# Patient Record
Sex: Female | Born: 2000 | Race: White | Hispanic: No | Marital: Single | Smoking: Never smoker
Health system: Southern US, Community
[De-identification: ages and names within clinical notes are randomized; demographics above are authoritative.]

## PROBLEM LIST (undated history)

## (undated) DIAGNOSIS — S060X9A Concussion with loss of consciousness of unspecified duration, initial encounter: Secondary | ICD-10-CM

## (undated) DIAGNOSIS — G43909 Migraine, unspecified, not intractable, without status migrainosus: Secondary | ICD-10-CM

## (undated) DIAGNOSIS — N83209 Unspecified ovarian cyst, unspecified side: Secondary | ICD-10-CM

## (undated) DIAGNOSIS — R112 Nausea with vomiting, unspecified: Secondary | ICD-10-CM

## (undated) DIAGNOSIS — D649 Anemia, unspecified: Secondary | ICD-10-CM

## (undated) DIAGNOSIS — N939 Abnormal uterine and vaginal bleeding, unspecified: Secondary | ICD-10-CM

## (undated) DIAGNOSIS — E785 Hyperlipidemia, unspecified: Secondary | ICD-10-CM

## (undated) DIAGNOSIS — R7989 Other specified abnormal findings of blood chemistry: Secondary | ICD-10-CM

## (undated) DIAGNOSIS — F419 Anxiety disorder, unspecified: Secondary | ICD-10-CM

## (undated) DIAGNOSIS — R790 Abnormal level of blood mineral: Secondary | ICD-10-CM

## (undated) DIAGNOSIS — Z9889 Other specified postprocedural states: Secondary | ICD-10-CM

## (undated) HISTORY — DX: Hyperlipidemia, unspecified: E78.5

## (undated) HISTORY — PX: BREAST SURGERY: SHX581

## (undated) HISTORY — DX: Anxiety disorder, unspecified: F41.9

## (undated) HISTORY — DX: Abnormal level of blood mineral: R79.0

## (undated) HISTORY — DX: Abnormal uterine and vaginal bleeding, unspecified: N93.9

---
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
## 2019-01-15 ENCOUNTER — Other Ambulatory Visit: Payer: Self-pay | Admitting: Obstetrics and Gynecology

## 2019-01-15 ENCOUNTER — Ambulatory Visit
Admission: RE | Admit: 2019-01-15 | Discharge: 2019-01-15 | Disposition: A | Discharge status: Home / Self Care | Payer: BLUE CROSS/BLUE SHIELD | Source: Ambulatory Visit | Attending: Obstetrics and Gynecology | Admitting: Obstetrics and Gynecology

## 2019-01-15 ENCOUNTER — Other Ambulatory Visit: Payer: Self-pay

## 2019-01-15 DIAGNOSIS — N6320 Unspecified lump in the left breast, unspecified quadrant: Secondary | ICD-10-CM

## 2019-01-28 ENCOUNTER — Other Ambulatory Visit: Payer: Self-pay | Admitting: Obstetrics and Gynecology

## 2019-01-28 ENCOUNTER — Ambulatory Visit
Admission: RE | Admit: 2019-01-28 | Discharge: 2019-01-28 | Disposition: A | Discharge status: Home / Self Care | Payer: BLUE CROSS/BLUE SHIELD | Source: Ambulatory Visit | Attending: Obstetrics and Gynecology | Admitting: Obstetrics and Gynecology

## 2019-01-28 ENCOUNTER — Other Ambulatory Visit (HOSPITAL_COMMUNITY): Payer: Self-pay | Admitting: Diagnostic Radiology

## 2019-01-28 ENCOUNTER — Other Ambulatory Visit: Payer: Self-pay

## 2019-01-28 DIAGNOSIS — N6320 Unspecified lump in the left breast, unspecified quadrant: Secondary | ICD-10-CM

## 2019-02-19 ENCOUNTER — Telehealth: Payer: Self-pay | Admitting: Obstetrics and Gynecology

## 2019-02-19 NOTE — Telephone Encounter (Signed)
Spoke with patient, advised per Dr. Quincy Simmonds. [id number] precautions reviewed. Patient verbalizes understanding and is agreeable.   Encounter closed.

## 2019-02-19 NOTE — Telephone Encounter (Signed)
Spoke with patient. Reports menses 2 weeks ago, bleeding has restarted, flow is light, changing pad twice daily. Patient reports lower abdominal/pelvic pain with intercourse, pain resolves after. "Newish" partner. OCP for contraceptive, denies any late or missed pills. Denies vaginal odor, discharge, N/V, fever/chills. Recommended OV for further evaluation. Patient is away at college, request OV next week. OV scheduled for 10/12 at 4:30pm with Dr. Quincy Simmonds. Recommended patient take UPT to r/o pregnancy, notify office if positive. Advised I will review with Dr. Quincy Simmonds and return call if any additional recommendations, patient agreeable.   Dr. Quincy Simmonds -please review.

## 2019-02-19 NOTE — Telephone Encounter (Signed)
Patient states she is having abdominal pain and abnormal bleeding. Has been going on for two days. Would like to speak with a nurse.

## 2019-02-19 NOTE — Telephone Encounter (Signed)
I agree with your recommendations.  She can try Ibuprofen 800 mg every 8 hours as needed for pain and try a heating pad.  If her pain increases during the weekend, I recommend going to urgent care.  Otherwise, I will see her on Monday.

## 2019-02-22 ENCOUNTER — Encounter: Payer: Self-pay | Admitting: Obstetrics and Gynecology

## 2019-02-22 ENCOUNTER — Other Ambulatory Visit: Payer: Self-pay

## 2019-02-22 ENCOUNTER — Ambulatory Visit: Payer: BC Managed Care – PPO | Admitting: Obstetrics and Gynecology

## 2019-02-22 VITALS — BP 110/68 | HR 80 | Temp 96.5°F | Ht 68.0 in | Wt 239.0 lb

## 2019-02-22 DIAGNOSIS — N941 Unspecified dyspareunia: Secondary | ICD-10-CM

## 2019-02-22 DIAGNOSIS — Z3009 Encounter for other general counseling and advice on contraception: Secondary | ICD-10-CM | POA: Diagnosis not present

## 2019-02-22 DIAGNOSIS — Z113 Encounter for screening for infections with a predominantly sexual mode of transmission: Secondary | ICD-10-CM

## 2019-02-22 DIAGNOSIS — N926 Irregular menstruation, unspecified: Secondary | ICD-10-CM

## 2019-02-22 LAB — POCT URINE PREGNANCY: Preg Test, Ur: NEGATIVE

## 2019-02-22 NOTE — Progress Notes (Signed)
GYNECOLOGY  VISIT   HPI: 18 y.o.   Single  Caucasian  female   [id number] with Patient's last menstrual period was 02/08/2019 (approximate).   here for irregular bleeding and pain with intercourse.   Irregular bleeding started 4 -5 days ago.  No missed pills.  Her pills are the same as usual.  No substitute at pharmacy. She has had a hx of breakthrough bleeding.  She takes Lamictal and Topamax.  She had pain and bleeding with intercourse.  This was the third time she had sex.  Prior to this, sex was not painful.  New-old partner.  Pain with deep deep penetration.  Using condoms.   No problems with nausea, vomiting, or diarrhea.   May be interested in IUDs.  Considering breast fibroma removal and breast reduction.  UPT:Neg  GYNECOLOGIC HISTORY: Patient's last menstrual period was 02/08/2019 (approximate). Contraception:  OCPs Menopausal hormone therapy:  n/a Last mammogram:  n/a Last pap smear:  n/a        OB History    Gravida  0   Para  0   Term  0   Preterm  0   AB  0   Living  0     SAB  0   TAB  0   Ectopic  0   Multiple  0   Live Births                 Patient Active Problem List   Diagnosis Date Noted  . Episodic tension-type headache, not intractable 03/05/2017  . Daytime somnolence 12/04/2016  . Acanthosis nigricans 08/13/2016  . Obesity 08/13/2016  . Abdominal pain 08/03/2014  . Dysmenorrhea 05/31/2014  . Obesity (BMI 30-39.9) 12/28/2013  . Migraine without aura 11/30/2013  . CHI (closed head injury) 02/09/2013    Past Medical History:  Diagnosis Date  . Abnormal uterine bleeding   . Anxiety   . Concussion 04/05/2013   volleyball  . Elevated serum creatinine 2020  . Hyperlipidemia   . Low ferritin 2019  . Migraines   . Ovarian cyst     Past Surgical History:  Procedure Laterality Date  . CYSTOSCOPY  11/16/14   [facility]  . LAPAROSCOPY N/A 08/16/2014   Procedure: LAPAROSCOPY DIAGNOSTIC, BIOPSY OF POSTERIOR CUL DE [place];   Surgeon: Donnamae Jude, MD;  Location: [facility];  Service: Gynecology;  Laterality: N/A;  . PELVIC LAPAROSCOPY  08/2014   Dr. Shelbie Ammons for endometriosis    Current Outpatient Medications  Medication Sig Dispense Refill  . buPROPion (WELLBUTRIN XL) 300 MG 24 hr tablet Take 300 mg by mouth every morning.    . escitalopram (LEXAPRO) 20 MG tablet     . ferrous sulfate 325 (65 FE) MG tablet Take 325 mg by mouth daily with breakfast.    . lamoTRIgine (LAMICTAL) 100 MG tablet     . norethindrone-ethinyl estradiol (BALZIVA) 0.4-35 MG-MCG tablet Take 1 tablet by mouth daily. 84 tablet 3  . oxycodone-acetaminophen (PERCOCET) 2.5-325 MG tablet Take 1 tablet by mouth as needed. Takes prn abdominal/back pain    . rizatriptan (MAXALT-MLT) 10 MG disintegrating tablet Take 1 tablet (10 mg total) by mouth as needed for migraine. May repeat in 2 hours if needed 10 tablet 5  . topiramate (TOPAMAX) 50 MG tablet TAKE 3 TABLETS BY MOUTH EVERY DAY AT BEDTIME 270 tablet 3   No current facility-administered medications for this visit.      ALLERGIES: Patient has no known allergies.  Family History  Problem  Relation Age of Onset  . Cancer Paternal Grandfather        dec 69 Leukemia  . Breast cancer Paternal Grandmother 45  . Seizures Mother        Epilepsy as child  . Diabetes Maternal Grandfather   . Thyroid disease Maternal Grandmother        hyperthyroid    Social History   Socioeconomic History  . Marital status: Single    Spouse name: Not on file  . Number of children: Not on file  . Years of education: Not on file  . Highest education level: Not on file  Occupational History  . Not on file  Social Needs  . Financial resource strain: Not on file  . Food insecurity    Worry: Not on file    Inability: Not on file  . Transportation needs    Medical: Not on file    Non-medical: Not on file  Tobacco Use  . Smoking status: Never Smoker  . Smokeless tobacco: Never Used  Substance and Sexual  Activity  . Alcohol use: No    Alcohol/week: 0.0 standard drinks  . Drug use: No  . Sexual activity: Never    Birth control/protection: Pill  Lifestyle  . Physical activity    Days per week: Not on file    Minutes per session: Not on file  . Stress: Not on file  Relationships  . Social Herbalist on phone: Not on file    Gets together: Not on file    Attends religious service: Not on file    Active member of club or organization: Not on file    Attends meetings of clubs or organizations: Not on file    Relationship status: Not on file  . Intimate partner violence    Fear of current or ex partner: Not on file    Emotionally abused: Not on file    Physically abused: Not on file    Forced sexual activity: Not on file  Other Topics Concern  . 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   She attended [employer].    She lives on campus during school.    She enjoys basketball, soccer, mission trips, and doing make-up.   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    Review of Systems  All other systems reviewed and are negative.   PHYSICAL EXAMINATION:    BP 110/68 (Cuff Size: Large)   Pulse 80   Temp (!) 96.5 F (35.8 C) (Temporal)   Ht 5\' 8"  (1.727 m)   Wt 239 lb (108.4 kg)   LMP 02/08/2019 (Approximate)   BMI 36.34 kg/m     General appearance: alert, cooperative and appears stated age   Pelvic: External genitalia:  no lesions              Urethra:  normal appearing urethra with no masses, tenderness or lesions              Bartholins and Skenes: normal                 Vagina: normal appearing vagina with normal color and discharge, no lesions              Cervix: no lesions                Bimanual Exam:  Uterus:  normal size, contour, position, consistency, mobility,  non-tender              Adnexa: no mass, fullness, tenderness        Chaperone was present for exam.  ASSESSMENT  Irregular  menses.  On continuous COCs. STD screening.  Dyspareunia.  Breast fibroadenoma.   PLAN  Information on IUDs - verbal and written.  We focused on [place].  We discussed controlling depth of penetration and using lubricants and position changes with sex.  Nuswab for GC/CT/trich.  Unable to do blood work today to complete STD screening.  We discussed breast reduction and fibromas of the breast.  I highlighted the difference of these procedures.   I suggested she have a consultation with a plastic surgeon to understand breast reduction further and help her make her choice. FU prn.    An After Visit Summary was printed and given to the patient.  __25____ minutes face to face time of which over 50% was spent in counseling.

## 2019-02-25 LAB — CHLAMYDIA/GONOCOCCUS/TRICHOMONAS, NAA
Chlamydia by NAA: NEGATIVE
Gonococcus by NAA: NEGATIVE
Trich vag by NAA: NEGATIVE

## 2019-03-24 ENCOUNTER — Telehealth: Payer: Self-pay | Admitting: Obstetrics and Gynecology

## 2019-03-24 DIAGNOSIS — Z3009 Encounter for other general counseling and advice on contraception: Secondary | ICD-10-CM

## 2019-03-24 NOTE — Telephone Encounter (Signed)
Patient would like to schedule IUD placement.

## 2019-03-24 NOTE — Telephone Encounter (Signed)
Spoke with pt. Pt states wanting to schedule IUD placement. Pt currently has OCPs. Pt home from college the week of thanksgiving and request appt that week. Pt scheduled IUD placement 04/06/19 at 1030am. Pt instructed Motrin 800 mg with food and water one hour before procedure. Pt agreeable.  Routing to provider for final review. Patient is agreeable to disposition. Will close encounter. CC: Weston Brass for precert. Order placed for IUD Insertion.

## 2019-03-25 MED ORDER — MISOPROSTOL 200 MCG PO TABS: ORAL_TABLET | ORAL | 0 refills | Status: DC

## 2019-03-25 NOTE — Addendum Note (Signed)
Addended by: Georgia Lopes on: 03/25/2019 08:44 AM   Modules accepted: Orders

## 2019-03-25 NOTE — Telephone Encounter (Signed)
Patient will need Cytotec 200 mcg per vagina the night before the insertion and the am of the insertion.  Disp:  2 RF:  None.  She will place this herself. Please let her know that this may cause some cramping.

## 2019-03-25 NOTE — Telephone Encounter (Signed)
Left detailed message per DPR. Will send in cytotec per Dr Elza Rafter order before IUD insertion. Pt to call back with any additional questions or concerns. Will close encounter.

## 2019-03-26 ENCOUNTER — Telehealth: Payer: Self-pay | Admitting: Obstetrics and Gynecology

## 2019-03-26 NOTE — Telephone Encounter (Signed)
Call placed to patient to convey benefits for iud.Spoke with the patient she understands/agreeable with the benefits. Patient aware of the cancellation policy. Appointment scheduled 04/06/19.

## 2019-04-05 NOTE — Progress Notes (Deleted)
GYNECOLOGY  VISIT   HPI: 18 y.o.   Single  Caucasian  female   G0P0000 with No LMP recorded. (Menstrual status: Oral contraceptives).   here for IUD insertion.   GYNECOLOGIC HISTORY: No LMP recorded. (Menstrual status: Oral contraceptives). Contraception:  OCPs Menopausal hormone therapy:  n/a Last mammogram:  n/a Last pap smear:   n/a        OB History    Gravida  0   Para  0   Term  0   Preterm  0   AB  0   Living  0     SAB  0   TAB  0   Ectopic  0   Multiple  0   Live Births                 Patient Active Problem List   Diagnosis Date Noted  . Episodic tension-type headache, not intractable 03/05/2017  . Daytime somnolence 12/04/2016  . Acanthosis nigricans 08/13/2016  . Obesity 08/13/2016  . Abdominal pain 08/03/2014  . Dysmenorrhea 05/31/2014  . Obesity (BMI 30-39.9) 12/28/2013  . Migraine without aura 11/30/2013  . CHI (closed head injury) 02/09/2013    Past Medical History:  Diagnosis Date  . Abnormal uterine bleeding   . Anxiety   . Concussion 04/05/2013   volleyball  . Elevated serum creatinine 2020  . Hyperlipidemia   . Low ferritin 2019  . Migraines   . Ovarian cyst     Past Surgical History:  Procedure Laterality Date  . CYSTOSCOPY  11/16/14   [facility]  . LAPAROSCOPY N/A 08/16/2014   Procedure: LAPAROSCOPY DIAGNOSTIC, BIOPSY OF POSTERIOR CUL DE [place];  Surgeon: Donnamae Jude, MD;  Location: [facility];  Service: Gynecology;  Laterality: N/A;  . PELVIC LAPAROSCOPY  08/2014   Dr. Shelbie Ammons for endometriosis    Current Outpatient Medications  Medication Sig Dispense Refill  . buPROPion (WELLBUTRIN XL) 300 MG 24 hr tablet Take 300 mg by mouth every morning.    . escitalopram (LEXAPRO) 20 MG tablet     . ferrous sulfate 325 (65 FE) MG tablet Take 325 mg by mouth daily with breakfast.    . lamoTRIgine (LAMICTAL) 100 MG tablet     . misoprostol (CYTOTEC) 200 MCG tablet Place one tablet in the vagina the night before IUD placement and 1  tablet in the vagina the morning of insertion appt. 2 tablet 0  . norethindrone-ethinyl estradiol (BALZIVA) 0.4-35 MG-MCG tablet Take 1 tablet by mouth daily. 84 tablet 3  . oxycodone-acetaminophen (PERCOCET) 2.5-325 MG tablet Take 1 tablet by mouth as needed. Takes prn abdominal/back pain    . rizatriptan (MAXALT-MLT) 10 MG disintegrating tablet Take 1 tablet (10 mg total) by mouth as needed for migraine. May repeat in 2 hours if needed 10 tablet 5  . topiramate (TOPAMAX) 50 MG tablet TAKE 3 TABLETS BY MOUTH EVERY DAY AT BEDTIME 270 tablet 3   No current facility-administered medications for this visit.      ALLERGIES: Patient has no known allergies.  Family History  Problem Relation Age of Onset  . Cancer Paternal Grandfather        dec 69 Leukemia  . Breast cancer Paternal Grandmother 22  . Seizures Mother        Epilepsy as child  . Diabetes Maternal Grandfather   . Thyroid disease Maternal Grandmother        hyperthyroid    Social History   Socioeconomic History  . Marital status:  Single    Spouse name: Not on file  . Number of children: Not on file  . Years of education: Not on file  . Highest education level: Not on file  Occupational History  . Not on file  Social Needs  . Financial resource strain: Not on file  . Food insecurity    Worry: Not on file    Inability: Not on file  . Transportation needs    Medical: Not on file    Non-medical: Not on file  Tobacco Use  . Smoking status: Never Smoker  . Smokeless tobacco: Never Used  Substance and Sexual Activity  . Alcohol use: No    Alcohol/week: 0.0 standard drinks  . Drug use: No  . Sexual activity: Never    Birth control/protection: Pill  Lifestyle  . Physical activity    Days per week: Not on file    Minutes per session: Not on file  . Stress: Not on file  Relationships  . Social Herbalist on phone: Not on file    Gets together: Not on file    Attends religious service: Not on file     Active member of club or organization: Not on file    Attends meetings of clubs or organizations: Not on file    Relationship status: Not on file  . Intimate partner violence    Fear of current or ex partner: Not on file    Emotionally abused: Not on file    Physically abused: Not on file    Forced sexual activity: Not on file  Other Topics Concern  . 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   She attended [employer].    She lives on campus during school.    She enjoys basketball, soccer, mission trips, and doing make-up.   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    Review of Systems  PHYSICAL EXAMINATION:    There were no vitals taken for this visit.    General appearance: alert, cooperative and appears stated age Head: Normocephalic, without obvious abnormality, atraumatic Neck: no adenopathy, supple, symmetrical, trachea midline and thyroid normal to inspection and palpation Lungs: clear to auscultation bilaterally Breasts: normal appearance, no masses or tenderness, No nipple retraction or dimpling, No nipple discharge or bleeding, No axillary or supraclavicular adenopathy Heart: regular rate and rhythm Abdomen: soft, non-tender, no masses,  no organomegaly Extremities: extremities normal, atraumatic, no cyanosis or edema Skin: Skin color, texture, turgor normal. No rashes or lesions Lymph nodes: Cervical, supraclavicular, and axillary nodes normal. No abnormal inguinal nodes palpated Neurologic: Grossly normal  Pelvic: External genitalia:  no lesions              Urethra:  normal appearing urethra with no masses, tenderness or lesions              Bartholins and Skenes: normal                 Vagina: normal appearing vagina with normal color and discharge, no lesions              Cervix: no lesions                Bimanual Exam:  Uterus:  normal size, contour, position, consistency, mobility, non-tender               Adnexa: no mass, fullness, tenderness  Rectal exam: {yes no:314532}.  Confirms.              Anus:  normal sphincter tone, no lesions  Chaperone was present for exam.  ASSESSMENT     PLAN     An After Visit Summary was printed and given to the patient.  ______ minutes face to face time of which over 50% was spent in counseling.

## 2019-04-06 ENCOUNTER — Ambulatory Visit: Payer: Self-pay | Admitting: Obstetrics and Gynecology

## 2019-04-06 ENCOUNTER — Telehealth: Payer: Self-pay | Admitting: Obstetrics and Gynecology

## 2019-04-06 NOTE — Telephone Encounter (Signed)
Patient would like to speak with nurse before procedure appointment at 10:30 today.

## 2019-04-06 NOTE — Telephone Encounter (Signed)
Call to patient. Patient states she forgot to take the cytotec medication that was prescribed. States she is currently still at home as she didn't want to come to the office if procedure could not be done. Reviewed with nursing supervisor. Patient rescheduled to 04-07-2019 at 1330. Patient agreeable to date and time of appointment. Patient advised of importance of taking cytotec tonight and in the am prior to procedure. Patient verbalized understanding and agreeable.   Routing to provider and will close encounter.

## 2019-04-07 ENCOUNTER — Ambulatory Visit: Payer: BC Managed Care – PPO | Admitting: Obstetrics and Gynecology

## 2019-04-07 NOTE — Progress Notes (Deleted)
GYNECOLOGY  VISIT   HPI: 18 y.o.   Single  Caucasian  female   G0P0000 with No LMP recorded. (Menstrual status: Oral contraceptives).   here for IUD insertion.    GYNECOLOGIC HISTORY: No LMP recorded. (Menstrual status: Oral contraceptives). Contraception:  OCPs Menopausal hormone therapy:  n/a Last mammogram:  n/a Last pap smear:   n/a        OB History    Gravida  0   Para  0   Term  0   Preterm  0   AB  0   Living  0     SAB  0   TAB  0   Ectopic  0   Multiple  0   Live Births                 Patient Active Problem List   Diagnosis Date Noted  . Episodic tension-type headache, not intractable 03/05/2017  . Daytime somnolence 12/04/2016  . Acanthosis nigricans 08/13/2016  . Obesity 08/13/2016  . Abdominal pain 08/03/2014  . Dysmenorrhea 05/31/2014  . Obesity (BMI 30-39.9) 12/28/2013  . Migraine without aura 11/30/2013  . CHI (closed head injury) 02/09/2013    Past Medical History:  Diagnosis Date  . Abnormal uterine bleeding   . Anxiety   . Concussion 04/05/2013   volleyball  . Elevated serum creatinine 2020  . Hyperlipidemia   . Low ferritin 2019  . Migraines   . Ovarian cyst     Past Surgical History:  Procedure Laterality Date  . CYSTOSCOPY  11/16/14   [facility]  . LAPAROSCOPY N/A 08/16/2014   Procedure: LAPAROSCOPY DIAGNOSTIC, BIOPSY OF POSTERIOR CUL DE [place];  Surgeon: Donnamae Jude, MD;  Location: [facility];  Service: Gynecology;  Laterality: N/A;  . PELVIC LAPAROSCOPY  08/2014   Dr. Shelbie Ammons for endometriosis    Current Outpatient Medications  Medication Sig Dispense Refill  . buPROPion (WELLBUTRIN XL) 300 MG 24 hr tablet Take 300 mg by mouth every morning.    . escitalopram (LEXAPRO) 20 MG tablet     . ferrous sulfate 325 (65 FE) MG tablet Take 325 mg by mouth daily with breakfast.    . lamoTRIgine (LAMICTAL) 100 MG tablet     . misoprostol (CYTOTEC) 200 MCG tablet Place one tablet in the vagina the night before IUD placement and 1  tablet in the vagina the morning of insertion appt. 2 tablet 0  . norethindrone-ethinyl estradiol (BALZIVA) 0.4-35 MG-MCG tablet Take 1 tablet by mouth daily. 84 tablet 3  . oxycodone-acetaminophen (PERCOCET) 2.5-325 MG tablet Take 1 tablet by mouth as needed. Takes prn abdominal/back pain    . rizatriptan (MAXALT-MLT) 10 MG disintegrating tablet Take 1 tablet (10 mg total) by mouth as needed for migraine. May repeat in 2 hours if needed 10 tablet 5  . topiramate (TOPAMAX) 50 MG tablet TAKE 3 TABLETS BY MOUTH EVERY DAY AT BEDTIME 270 tablet 3   No current facility-administered medications for this visit.      ALLERGIES: Patient has no known allergies.  Family History  Problem Relation Age of Onset  . Cancer Paternal Grandfather        dec 69 Leukemia  . Breast cancer Paternal Grandmother 22  . Seizures Mother        Epilepsy as child  . Diabetes Maternal Grandfather   . Thyroid disease Maternal Grandmother        hyperthyroid    Social History   Socioeconomic History  . Marital  status: Single    Spouse name: Not on file  . Number of children: Not on file  . Years of education: Not on file  . Highest education level: Not on file  Occupational History  . Not on file  Social Needs  . Financial resource strain: Not on file  . Food insecurity    Worry: Not on file    Inability: Not on file  . Transportation needs    Medical: Not on file    Non-medical: Not on file  Tobacco Use  . Smoking status: Never Smoker  . Smokeless tobacco: Never Used  Substance and Sexual Activity  . Alcohol use: No    Alcohol/week: 0.0 standard drinks  . Drug use: No  . Sexual activity: Never    Birth control/protection: Pill  Lifestyle  . Physical activity    Days per week: Not on file    Minutes per session: Not on file  . Stress: Not on file  Relationships  . Social Herbalist on phone: Not on file    Gets together: Not on file    Attends religious service: Not on file     Active member of club or organization: Not on file    Attends meetings of clubs or organizations: Not on file    Relationship status: Not on file  . Intimate partner violence    Fear of current or ex partner: Not on file    Emotionally abused: Not on file    Physically abused: Not on file    Forced sexual activity: Not on file  Other Topics Concern  . 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   She attended [employer].    She lives on campus during school.    She enjoys basketball, soccer, mission trips, and doing make-up.   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    Review of Systems  PHYSICAL EXAMINATION:    There were no vitals taken for this visit.    General appearance: alert, cooperative and appears stated age Head: Normocephalic, without obvious abnormality, atraumatic Neck: no adenopathy, supple, symmetrical, trachea midline and thyroid normal to inspection and palpation Lungs: clear to auscultation bilaterally Breasts: normal appearance, no masses or tenderness, No nipple retraction or dimpling, No nipple discharge or bleeding, No axillary or supraclavicular adenopathy Heart: regular rate and rhythm Abdomen: soft, non-tender, no masses,  no organomegaly Extremities: extremities normal, atraumatic, no cyanosis or edema Skin: Skin color, texture, turgor normal. No rashes or lesions Lymph nodes: Cervical, supraclavicular, and axillary nodes normal. No abnormal inguinal nodes palpated Neurologic: Grossly normal  Pelvic: External genitalia:  no lesions              Urethra:  normal appearing urethra with no masses, tenderness or lesions              Bartholins and Skenes: normal                 Vagina: normal appearing vagina with normal color and discharge, no lesions              Cervix: no lesions                Bimanual Exam:  Uterus:  normal size, contour, position, consistency, mobility, non-tender               Adnexa: no mass, fullness, tenderness  Rectal exam: {yes no:314532}.  Confirms.              Anus:  normal sphincter tone, no lesions  Chaperone was present for exam.  ASSESSMENT     PLAN     An After Visit Summary was printed and given to the patient.  ______ minutes face to face time of which over 50% was spent in counseling.

## 2019-04-13 ENCOUNTER — Other Ambulatory Visit: Payer: Self-pay | Admitting: Obstetrics and Gynecology

## 2019-04-13 DIAGNOSIS — Z3009 Encounter for other general counseling and advice on contraception: Secondary | ICD-10-CM

## 2019-04-13 MED ORDER — MISOPROSTOL 200 MCG PO TABS: ORAL_TABLET | ORAL | 0 refills | Status: DC

## 2019-04-13 NOTE — Telephone Encounter (Signed)
Pt called and rescheduled Mirena IUD placement since was cancelled d/t Dr Quincy Simmonds office schedule and pt not taking cytotec as prescribed.   Pt scheduled on 04/19/19 at 4:30pm. Pt instructed to take Motrin 800 mg with food and water one hour before procedure and how to insert cytotec for procedure. All questions answered.  Orders pended if appropriate for cytotec. Marland Kitchen LMP 04/13/19. Pt aware to remain abstinent until procedure. Pt agreeable.   Will route to Dr Quincy Simmonds for any additional recommendations.   Orders placed for IUD insertion CC: [place], [place] for precert.

## 2019-04-13 NOTE — Telephone Encounter (Signed)
Patient calling to reschedule IUD placement.

## 2019-04-14 NOTE — Telephone Encounter (Signed)
Placed call to pt to make aware of cytotec ordered and instructed use. Pt verbalized understanding.   Will close encounter.

## 2019-04-15 ENCOUNTER — Other Ambulatory Visit: Payer: Self-pay

## 2019-04-15 DIAGNOSIS — F411 Generalized anxiety disorder: Secondary | ICD-10-CM | POA: Diagnosis not present

## 2019-04-15 DIAGNOSIS — F4010 Social phobia, unspecified: Secondary | ICD-10-CM | POA: Diagnosis not present

## 2019-04-15 DIAGNOSIS — F339 Major depressive disorder, recurrent, unspecified: Secondary | ICD-10-CM | POA: Diagnosis not present

## 2019-04-15 DIAGNOSIS — F410 Panic disorder [episodic paroxysmal anxiety] without agoraphobia: Secondary | ICD-10-CM | POA: Diagnosis not present

## 2019-04-19 ENCOUNTER — Other Ambulatory Visit: Payer: Self-pay

## 2019-04-19 ENCOUNTER — Ambulatory Visit (INDEPENDENT_AMBULATORY_CARE_PROVIDER_SITE_OTHER): Payer: BC Managed Care – PPO | Admitting: Obstetrics and Gynecology

## 2019-04-19 ENCOUNTER — Encounter: Payer: Self-pay | Admitting: Obstetrics and Gynecology

## 2019-04-19 VITALS — BP 112/76 | HR 76 | Temp 96.6°F | Ht 68.0 in | Wt 247.0 lb

## 2019-04-19 DIAGNOSIS — Z01812 Encounter for preprocedural laboratory examination: Secondary | ICD-10-CM

## 2019-04-19 DIAGNOSIS — Z3009 Encounter for other general counseling and advice on contraception: Secondary | ICD-10-CM

## 2019-04-19 DIAGNOSIS — Z30430 Encounter for insertion of intrauterine contraceptive device: Secondary | ICD-10-CM

## 2019-04-19 LAB — POCT URINE PREGNANCY: Preg Test, Ur: NEGATIVE

## 2019-04-19 NOTE — Progress Notes (Signed)
GYNECOLOGY  VISIT   HPI: 18 y.o.   Single  Caucasian  female   G0P0000 with Patient's last menstrual period was 04/13/2019 (exact date).   here for IUD insertion.  Negative GC/CT/Trich on 02/22/19.   UPT:Neg    GYNECOLOGIC HISTORY: Patient's last menstrual period was 04/13/2019 (exact date). Contraception:  OCPs Menopausal hormone therapy:  n/a Last mammogram:  n/a Last pap smear:   n/a        OB History    Gravida  0   Para  0   Term  0   Preterm  0   AB  0   Living  0     SAB  0   TAB  0   Ectopic  0   Multiple  0   Live Births                 Patient Active Problem List   Diagnosis Date Noted  . Episodic tension-type headache, not intractable 03/05/2017  . Daytime somnolence 12/04/2016  . Acanthosis nigricans 08/13/2016  . Obesity 08/13/2016  . Abdominal pain 08/03/2014  . Dysmenorrhea 05/31/2014  . Obesity (BMI 30-39.9) 12/28/2013  . Migraine without aura 11/30/2013  . CHI (closed head injury) 02/09/2013    Past Medical History:  Diagnosis Date  . Abnormal uterine bleeding   . Anxiety   . Concussion 04/05/2013   volleyball  . Elevated serum creatinine 2020  . Hyperlipidemia   . Low ferritin 2019  . Migraines   . Ovarian cyst     Past Surgical History:  Procedure Laterality Date  . CYSTOSCOPY  11/16/14   [facility]  . LAPAROSCOPY N/A 08/16/2014   Procedure: LAPAROSCOPY DIAGNOSTIC, BIOPSY OF POSTERIOR CUL DE [place];  Surgeon: Donnamae Jude, MD;  Location: [facility];  Service: Gynecology;  Laterality: N/A;  . PELVIC LAPAROSCOPY  08/2014   Dr. Shelbie Ammons for endometriosis    Current Outpatient Medications  Medication Sig Dispense Refill  . amphetamine-dextroamphetamine (ADDERALL XR) 10 MG 24 hr capsule Take 10 mg by mouth daily.    Marland Kitchen buPROPion (WELLBUTRIN XL) 300 MG 24 hr tablet Take 300 mg by mouth every morning.    . escitalopram (LEXAPRO) 20 MG tablet     . ferrous sulfate 325 (65 FE) MG tablet Take 325 mg by mouth daily with breakfast.    .  lamoTRIgine (LAMICTAL) 100 MG tablet     . misoprostol (CYTOTEC) 200 MCG tablet Place one tablet in the vagina the night before IUD placement and 1 tablet in the vagina the morning of insertion appt. 2 tablet 0  . norethindrone-ethinyl estradiol (BALZIVA) 0.4-35 MG-MCG tablet Take 1 tablet by mouth daily. 84 tablet 3  . oxycodone-acetaminophen (PERCOCET) 2.5-325 MG tablet Take 1 tablet by mouth as needed. Takes prn abdominal/back pain    . rizatriptan (MAXALT-MLT) 10 MG disintegrating tablet Take 1 tablet (10 mg total) by mouth as needed for migraine. May repeat in 2 hours if needed 10 tablet 5  . topiramate (TOPAMAX) 50 MG tablet TAKE 3 TABLETS BY MOUTH EVERY DAY AT BEDTIME 270 tablet 3   No current facility-administered medications for this visit.      ALLERGIES: Patient has no known allergies.  Family History  Problem Relation Age of Onset  . Cancer Paternal Grandfather        dec 69 Leukemia  . Breast cancer Paternal Grandmother 64  . Seizures Mother        Epilepsy as child  . Diabetes Maternal  Grandfather   . Thyroid disease Maternal Grandmother        hyperthyroid    Social History   Socioeconomic History  . Marital status: Single    Spouse name: Not on file  . Number of children: Not on file  . Years of education: Not on file  . Highest education level: Not on file  Occupational History  . Not on file  Social Needs  . Financial resource strain: Not on file  . Food insecurity    Worry: Not on file    Inability: Not on file  . Transportation needs    Medical: Not on file    Non-medical: Not on file  Tobacco Use  . Smoking status: Never Smoker  . Smokeless tobacco: Never Used  Substance and Sexual Activity  . Alcohol use: No    Alcohol/week: 0.0 standard drinks  . Drug use: No  . Sexual activity: Never    Birth control/protection: Pill  Lifestyle  . Physical activity    Days per week: Not on file    Minutes per session: Not on file  . Stress: Not on file   Relationships  . Social Herbalist on phone: Not on file    Gets together: Not on file    Attends religious service: Not on file    Active member of club or organization: Not on file    Attends meetings of clubs or organizations: Not on file    Relationship status: Not on file  . Intimate partner violence    Fear of current or ex partner: Not on file    Emotionally abused: Not on file    Physically abused: Not on file    Forced sexual activity: Not on file  Other Topics Concern  . 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   She attended [employer].    She lives on campus during school.    She enjoys basketball, soccer, mission trips, and doing make-up.   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    Review of Systems  All other systems reviewed and are negative.   PHYSICAL EXAMINATION:    BP 112/76 (Cuff Size: Large)   Pulse 76   Temp (!) 96.6 F (35.9 C) (Temporal)   Ht 5\' 8"  (1.727 m)   Wt 247 lb (112 kg)   LMP 04/13/2019 (Exact Date)   BMI 37.56 kg/m     General appearance: alert, cooperative and appears stated age  Pelvic: External genitalia:  no lesions              Urethra:  normal appearing urethra with no masses, tenderness or lesions              Bartholins and Skenes: normal                 Vagina: normal appearing vagina with normal color and discharge, no lesions              Cervix: no lesions                Bimanual Exam:  Uterus:  normal size, contour, position, consistency, mobility, non-tender              Adnexa: no mass, fullness, tenderness             IUD insertion - Kyleena.  Lot number [id number], exp April 2022.  Consent for procedure.  Sterile prep with Hibiclens.  Paracervical block with 10 cc 1% lidocaine, lot [id number] exp April 2022. Uterus sounded to 7 cm.  Os finder also used. Kyleena IUD placed without difficulty.  Strings trimmed and shown to patient.   Repeat BM exam, no change.  Chaperone was present for exam.  ASSESSMENT  Kyleena IUD insertion.   PLAN  Post IUD instructions given.  Back up protection needed for one week.  Fu in 4 - 5 weeks.    An After Visit Summary was printed and given to the patient.

## 2019-04-19 NOTE — Patient Instructions (Signed)
Intrauterine Device Insertion, Care After  This sheet gives you information about how to care for yourself after your procedure. Your health care provider may also give you more specific instructions. If you have problems or questions, contact your health care provider. What can I expect after the procedure? After the procedure, it is common to have:  Cramps and pain in the abdomen.  Light bleeding (spotting) or heavier bleeding that is like your menstrual period. This may last for up to a few days.  Lower back pain.  Dizziness.  Headaches.  Nausea. Follow these instructions at home:  Before resuming sexual activity, check to make sure that you can feel the IUD string(s). You should be able to feel the end of the string(s) below the opening of your cervix. If your IUD string is in place, you may resume sexual activity. ? If you had a hormonal IUD inserted more than 7 days after your most recent period started, you will need to use a backup method of birth control for 7 days after IUD insertion. Ask your health care provider whether this applies to you.  Continue to check that the IUD is still in place by feeling for the string(s) after every menstrual period, or once a month.  Take over-the-counter and prescription medicines only as told by your health care provider.  Do not drive or use heavy machinery while taking prescription pain medicine.  Keep all follow-up visits as told by your health care provider. This is important. Contact a health care provider if:  You have bleeding that is heavier or lasts longer than a normal menstrual cycle.  You have a fever.  You have cramps or abdominal pain that get worse or do not get better with medicine.  You develop abdominal pain that is new or is not in the same area of earlier cramping and pain.  You feel lightheaded or weak.  You have abnormal or bad-smelling discharge from your vagina.  You have pain during sexual activity.   You have any of the following problems with your IUD string(s): ? The string bothers or hurts you or your sexual partner. ? You cannot feel the string. ? The string has gotten longer.  You can feel the IUD in your vagina.  You think you may be pregnant, or you miss your menstrual period.  You think you may have an STI (sexually transmitted infection). Get help right away if:  You have flu-like symptoms.  You have a fever and chills.  You can feel that your IUD has slipped out of place. Summary  After the procedure, it is common to have cramps and pain in the abdomen. It is also common to have light bleeding (spotting) or heavier bleeding that is like your menstrual period.  Continue to check that the IUD is still in place by feeling for the string(s) after every menstrual period, or once a month.  Keep all follow-up visits as told by your health care provider. This is important.  Contact your health care provider if you have problems with your IUD string(s), such as the string getting longer or bothering you or your sexual partner. This information is not intended to replace advice given to you by your health care provider. Make sure you discuss any questions you have with your health care provider. Document Released: 12/26/2010 Document Revised: 04/11/2017 Document Reviewed: 03/20/2016 Elsevier Patient Education  2020 [employer].

## 2019-04-21 DIAGNOSIS — N62 Hypertrophy of breast: Secondary | ICD-10-CM | POA: Diagnosis not present

## 2019-05-04 DIAGNOSIS — F410 Panic disorder [episodic paroxysmal anxiety] without agoraphobia: Secondary | ICD-10-CM | POA: Diagnosis not present

## 2019-05-04 DIAGNOSIS — F4010 Social phobia, unspecified: Secondary | ICD-10-CM | POA: Diagnosis not present

## 2019-05-04 DIAGNOSIS — F339 Major depressive disorder, recurrent, unspecified: Secondary | ICD-10-CM | POA: Diagnosis not present

## 2019-05-04 DIAGNOSIS — F411 Generalized anxiety disorder: Secondary | ICD-10-CM | POA: Diagnosis not present

## 2019-05-06 DIAGNOSIS — Z20828 Contact with and (suspected) exposure to other viral communicable diseases: Secondary | ICD-10-CM | POA: Diagnosis not present

## 2019-05-19 ENCOUNTER — Other Ambulatory Visit: Payer: Self-pay

## 2019-05-19 DIAGNOSIS — Z20828 Contact with and (suspected) exposure to other viral communicable diseases: Secondary | ICD-10-CM | POA: Diagnosis not present

## 2019-05-19 NOTE — Progress Notes (Signed)
GYNECOLOGY  VISIT   HPI: 19 y.o.   Single  Caucasian  female   G0P0000 with No LMP recorded. (Menstrual status: IUD).   here for 4 week follow up after [facility] IUD insertion.  Had a shorter and lighter period for as long as she can remember.  She had some spotting before and after her period.  No pain outside of her period.  No dyspareunia.  Using condoms.  GYNECOLOGIC HISTORY: No LMP recorded. (Menstrual status: IUD). Contraception:  Kyleena IUD 04-19-19 Menopausal hormone therapy:  n/a Last mammogram:  n/a Last pap smear:   n/a        OB History    Gravida  0   Para  0   Term  0   Preterm  0   AB  0   Living  0     SAB  0   TAB  0   Ectopic  0   Multiple  0   Live Births                 Patient Active Problem List   Diagnosis Date Noted  . Episodic tension-type headache, not intractable 03/05/2017  . Daytime somnolence 12/04/2016  . Acanthosis nigricans 08/13/2016  . Obesity 08/13/2016  . Abdominal pain 08/03/2014  . Dysmenorrhea 05/31/2014  . Obesity (BMI 30-39.9) 12/28/2013  . Migraine without aura 11/30/2013  . CHI (closed head injury) 02/09/2013    Past Medical History:  Diagnosis Date  . Abnormal uterine bleeding   . Anxiety   . Concussion 04/05/2013   volleyball  . Elevated serum creatinine 2020  . Hyperlipidemia   . Low ferritin 2019  . Migraines   . Ovarian cyst     Past Surgical History:  Procedure Laterality Date  . CYSTOSCOPY  11/16/14   [facility]  . LAPAROSCOPY N/A 08/16/2014   Procedure: LAPAROSCOPY DIAGNOSTIC, BIOPSY OF POSTERIOR CUL DE [place];  Surgeon: Donnamae Jude, MD;  Location: [facility];  Service: Gynecology;  Laterality: N/A;  . PELVIC LAPAROSCOPY  08/2014   Dr. Shelbie Ammons for endometriosis    Current Outpatient Medications  Medication Sig Dispense Refill  . amphetamine-dextroamphetamine (ADDERALL XR) 10 MG 24 hr capsule Take 10 mg by mouth daily.    Marland Kitchen buPROPion (WELLBUTRIN XL) 300 MG 24 hr tablet Take 300 mg by mouth  every morning.    . escitalopram (LEXAPRO) 20 MG tablet     . ferrous sulfate 325 (65 FE) MG tablet Take 325 mg by mouth daily with breakfast.    . ibuprofen (ADVIL) 200 MG tablet Take 3 tablets by mouth 3 (three) times daily.    Marland Kitchen lamoTRIgine (LAMICTAL) 100 MG tablet     . Levonorgestrel (KYLEENA) 19.5 MG IUD by Intrauterine route.    . misoprostol (CYTOTEC) 200 MCG tablet Place one tablet in the vagina the night before IUD placement and 1 tablet in the vagina the morning of insertion appt. 2 tablet 0  . norethindrone-ethinyl estradiol (BALZIVA) 0.4-35 MG-MCG tablet Take 1 tablet by mouth daily. 84 tablet 3  . oxycodone-acetaminophen (PERCOCET) 2.5-325 MG tablet Take 1 tablet by mouth as needed. Takes prn abdominal/back pain    . rizatriptan (MAXALT-MLT) 10 MG disintegrating tablet Take 1 tablet (10 mg total) by mouth as needed for migraine. May repeat in 2 hours if needed 10 tablet 5  . topiramate (TOPAMAX) 50 MG tablet TAKE 3 TABLETS BY MOUTH EVERY DAY AT BEDTIME 270 tablet 3   No current facility-administered medications for this  visit.     ALLERGIES: Patient has no known allergies.  Family History  Problem Relation Age of Onset  . Cancer Paternal Grandfather        dec 69 Leukemia  . Breast cancer Paternal Grandmother 31  . Seizures Mother        Epilepsy as child  . Diabetes Maternal Grandfather   . Thyroid disease Maternal Grandmother        hyperthyroid    Social History   Socioeconomic History  . Marital status: Single    Spouse name: Not on file  . Number of children: Not on file  . Years of education: Not on file  . Highest education level: Not on file  Occupational History  . Not on file  Tobacco Use  . Smoking status: Never Smoker  . Smokeless tobacco: Never Used  Substance and Sexual Activity  . Alcohol use: No    Alcohol/week: 0.0 standard drinks  . Drug use: No  . Sexual activity: Never    Birth control/protection: Pill  Other Topics Concern  . 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   She attended [employer].    She lives on campus during school.    She enjoys basketball, soccer, mission trips, and doing make-up.   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   Social Determinants of Health   Financial Resource Strain:   . Difficulty of Paying Living Expenses: Not on file  Food Insecurity:   . Worried About Charity fundraiser in the Last Year: Not on file  . Ran Out of Food in the Last Year: Not on file  Transportation Needs:   . Lack of Transportation (Medical): Not on file  . Lack of Transportation (Non-Medical): Not on file  Physical Activity:   . Days of Exercise per Week: Not on file  . Minutes of Exercise per Session: Not on file  Stress:   . Feeling of Stress : Not on file  Social Connections:   . Frequency of Communication with Friends and Family: Not on file  . Frequency of Social Gatherings with Friends and Family: Not on file  . Attends Religious Services: Not on file  . Active Member of Clubs or Organizations: Not on file  . Attends Archivist Meetings: Not on file  . Marital Status: Not on file  Intimate Partner Violence:   . Fear of Current or Ex-Partner: Not on file  . Emotionally Abused: Not on file  . Physically Abused: Not on file  . Sexually Abused: Not on file    Review of Systems  All other systems reviewed and are negative.   PHYSICAL EXAMINATION:    BP 112/72   Pulse 80   Temp 97.6 F (36.4 C) (Temporal)   Ht 5\' 8"  (1.727 m)   Wt 242 lb (109.8 kg)   BMI 36.80 kg/m     General appearance: alert, cooperative and appears stated age  Pelvic: External genitalia:  no lesions              Urethra:  normal appearing urethra with no masses, tenderness or lesions              Bartholins and Skenes: normal                 Vagina: normal appearing vagina with normal color and discharge, no lesions  Cervix: no  lesions.  IUD threads noted.                Bimanual Exam:  Uterus:  normal size, contour, position, consistency, mobility, non-tender              Adnexa: no mass, fullness, tenderness           Chaperone was present for exam.  ASSESSMENT   IUD check up.  Doing well with Kyleena.  PLAN  Reassurance regarding IUD position and effectiveness for preventing pregnancy.  Bleeding profile normal. Continue yearly well woman visits for IUD checks. We discussed potential Mirena IUD after childbearing.  Fu prn.   An After Visit Summary was printed and given to the patient.  __15____ minutes face to face time of which over 50% was spent in counseling.

## 2019-05-20 ENCOUNTER — Encounter: Payer: Self-pay | Admitting: Obstetrics and Gynecology

## 2019-05-20 ENCOUNTER — Ambulatory Visit: Payer: BC Managed Care – PPO | Admitting: Obstetrics and Gynecology

## 2019-05-20 VITALS — BP 112/72 | HR 80 | Temp 97.6°F | Ht 68.0 in | Wt 242.0 lb

## 2019-05-20 DIAGNOSIS — Z30431 Encounter for routine checking of intrauterine contraceptive device: Secondary | ICD-10-CM

## 2019-06-01 DIAGNOSIS — F4010 Social phobia, unspecified: Secondary | ICD-10-CM | POA: Diagnosis not present

## 2019-06-01 DIAGNOSIS — F411 Generalized anxiety disorder: Secondary | ICD-10-CM | POA: Diagnosis not present

## 2019-06-01 DIAGNOSIS — F410 Panic disorder [episodic paroxysmal anxiety] without agoraphobia: Secondary | ICD-10-CM | POA: Diagnosis not present

## 2019-06-01 DIAGNOSIS — F339 Major depressive disorder, recurrent, unspecified: Secondary | ICD-10-CM | POA: Diagnosis not present

## 2019-07-28 ENCOUNTER — Other Ambulatory Visit (INDEPENDENT_AMBULATORY_CARE_PROVIDER_SITE_OTHER): Payer: Self-pay | Admitting: Pediatrics

## 2019-07-28 DIAGNOSIS — G43009 Migraine without aura, not intractable, without status migrainosus: Secondary | ICD-10-CM

## 2019-08-18 ENCOUNTER — Telehealth: Payer: Self-pay | Admitting: Obstetrics and Gynecology

## 2019-08-18 NOTE — Telephone Encounter (Signed)
Patient has some covid vaccine concerns and future pregnancy.

## 2019-08-18 NOTE — Telephone Encounter (Signed)
Left message for pt to return call to triage RN.    Per Update to Date:   Are SARS-CoV-2 vaccines safe for pregnant women and women planning pregnancy? Probably. Pregnant women have been excluded from trials evaluatingCOVID-19 vaccines, thus there are no safety and efficacy data in this population. We suggest COVID-19 vaccination for pregnant women rather than deferring vaccination until after delivery, particularly for those at higher risk of exposure or severe disease if infected.

## 2019-08-20 ENCOUNTER — Other Ambulatory Visit (INDEPENDENT_AMBULATORY_CARE_PROVIDER_SITE_OTHER): Payer: Self-pay | Admitting: Pediatrics

## 2019-08-20 DIAGNOSIS — G43009 Migraine without aura, not intractable, without status migrainosus: Secondary | ICD-10-CM

## 2019-09-03 ENCOUNTER — Other Ambulatory Visit (INDEPENDENT_AMBULATORY_CARE_PROVIDER_SITE_OTHER): Payer: Self-pay | Admitting: Pediatrics

## 2019-09-03 DIAGNOSIS — G43009 Migraine without aura, not intractable, without status migrainosus: Secondary | ICD-10-CM

## 2019-09-06 DIAGNOSIS — F339 Major depressive disorder, recurrent, unspecified: Secondary | ICD-10-CM | POA: Diagnosis not present

## 2019-09-06 DIAGNOSIS — F902 Attention-deficit hyperactivity disorder, combined type: Secondary | ICD-10-CM | POA: Diagnosis not present

## 2019-09-06 DIAGNOSIS — F4010 Social phobia, unspecified: Secondary | ICD-10-CM | POA: Diagnosis not present

## 2019-09-06 DIAGNOSIS — F411 Generalized anxiety disorder: Secondary | ICD-10-CM | POA: Diagnosis not present

## 2019-09-14 ENCOUNTER — Telehealth: Payer: Self-pay | Admitting: Obstetrics and Gynecology

## 2019-09-14 NOTE — Telephone Encounter (Signed)
Spoke to pt. Pt requesting when started and ended on Depo Provera due to looking at weight gain chart. Pt was on Depo Provera from our records 06/2015-08/2016.   Pt currently has [place] IUD and doing well. No issues or complaints at this time.   Encounter closed

## 2019-09-14 NOTE — Telephone Encounter (Signed)
Patient  wants to know when she started depo provera.

## 2019-09-14 NOTE — Telephone Encounter (Signed)
Spoke with pt. Pt states wanting to know if safe getting Covid Vaccine for future pregnancies. Pt given information per CDC and Up to Date. Pt states has 1st covid vaccine scheduled 09/21/19. Pt verbalized understanding.   Encounter closed.

## 2019-11-05 ENCOUNTER — Other Ambulatory Visit (INDEPENDENT_AMBULATORY_CARE_PROVIDER_SITE_OTHER): Payer: Self-pay | Admitting: Pediatrics

## 2019-11-05 DIAGNOSIS — G43009 Migraine without aura, not intractable, without status migrainosus: Secondary | ICD-10-CM

## 2019-11-24 ENCOUNTER — Telehealth: Payer: Self-pay | Admitting: Obstetrics and Gynecology

## 2019-11-24 DIAGNOSIS — D242 Benign neoplasm of left breast: Secondary | ICD-10-CM

## 2019-11-24 DIAGNOSIS — D241 Benign neoplasm of right breast: Secondary | ICD-10-CM

## 2019-11-24 NOTE — Telephone Encounter (Signed)
[place] for referral to general surgeon, Dr. Donne Hazel.

## 2019-11-24 NOTE — Telephone Encounter (Signed)
Spoke with Rebecca Dunn. Rebecca Dunn given recommendations per Dr Quincy Simmonds. Rebecca Dunn agreeable.   Referral placed for Dr Donne Hazel at [facility]. Rebecca Dunn aware of call from business office for benefits.   Routing to Dr Quincy Simmonds for review. Encounter closed  Cc: [facility] for referral

## 2019-11-24 NOTE — Telephone Encounter (Addendum)
Patient has questions regarding breast cysts. She has class this morning so please call after 1pm.

## 2019-11-24 NOTE — Telephone Encounter (Signed)
Spoke with pt. 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 Pt states has discussed with mother and would like recommendations to breast surgeon that Dr Quincy Simmonds recommends for removal.  Pt denies any current problems.  Pt advised will review with Dr Quincy Simmonds and return call to pt. Pt agreeable.   Routing to Dr Quincy Simmonds.

## 2019-11-27 ENCOUNTER — Other Ambulatory Visit (INDEPENDENT_AMBULATORY_CARE_PROVIDER_SITE_OTHER): Payer: Self-pay | Admitting: Pediatrics

## 2019-11-27 DIAGNOSIS — G43009 Migraine without aura, not intractable, without status migrainosus: Secondary | ICD-10-CM

## 2019-11-30 DIAGNOSIS — F4010 Social phobia, unspecified: Secondary | ICD-10-CM | POA: Diagnosis not present

## 2019-11-30 DIAGNOSIS — F902 Attention-deficit hyperactivity disorder, combined type: Secondary | ICD-10-CM | POA: Diagnosis not present

## 2019-11-30 DIAGNOSIS — F339 Major depressive disorder, recurrent, unspecified: Secondary | ICD-10-CM | POA: Diagnosis not present

## 2019-12-13 ENCOUNTER — Other Ambulatory Visit (INDEPENDENT_AMBULATORY_CARE_PROVIDER_SITE_OTHER): Payer: Self-pay | Admitting: Pediatrics

## 2019-12-13 DIAGNOSIS — G43009 Migraine without aura, not intractable, without status migrainosus: Secondary | ICD-10-CM

## 2019-12-13 NOTE — Telephone Encounter (Signed)
Please approve 90 day rx request

## 2019-12-14 ENCOUNTER — Other Ambulatory Visit: Payer: Self-pay

## 2019-12-14 ENCOUNTER — Other Ambulatory Visit (HOSPITAL_COMMUNITY)
Admission: RE | Admit: 2019-12-14 | Discharge: 2019-12-14 | Disposition: A | Discharge status: Home / Self Care | Payer: BC Managed Care – PPO | Source: Ambulatory Visit | Attending: Obstetrics and Gynecology | Admitting: Obstetrics and Gynecology

## 2019-12-14 ENCOUNTER — Encounter: Payer: Self-pay | Admitting: Obstetrics and Gynecology

## 2019-12-14 ENCOUNTER — Ambulatory Visit (INDEPENDENT_AMBULATORY_CARE_PROVIDER_SITE_OTHER): Payer: BC Managed Care – PPO | Admitting: Obstetrics and Gynecology

## 2019-12-14 VITALS — BP 110/74 | HR 80 | Resp 16 | Ht 68.0 in | Wt 242.6 lb

## 2019-12-14 DIAGNOSIS — Z113 Encounter for screening for infections with a predominantly sexual mode of transmission: Secondary | ICD-10-CM

## 2019-12-14 DIAGNOSIS — Z01419 Encounter for gynecological examination (general) (routine) without abnormal findings: Secondary | ICD-10-CM

## 2019-12-14 NOTE — Patient Instructions (Signed)

## 2019-12-14 NOTE — Progress Notes (Signed)
19 y.o. G17P0000 Single Caucasian female here for annual exam.    No periods with her Rebecca Dunn IUD.  Can still have some cramping, but no bleeding.   Dating a new partner for a few months.  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   She will see Dr. Donne Hazel on 01/07/20 for possible remove of left breast fibroadenoma. She needs to change the appointment date.   Received Covid vaccine [employer].    PCP: Terrill Mohr, MD   No LMP recorded. (Menstrual status: IUD).     Period Cycle (Days):  (no cycles with Kyleena IUD)     Sexually active: Yes.    The current method of family planning is Kyleena IUD 04/19/19.    Exercising: No.  The patient does not participate in regular exercise at present. Smoker:  no  Health Maintenance: Pap:  n/a History of abnormal Pap:  n/a MMG:  01/15/19 Right and Left Breast US - BIRADS 3:Probably Benign -- 01/28/19 left breast bx showed Fibroadenoma TDaP:  2012 Gardasil:   Completed HIV: no Hep C: no Screening Labs:  PCP.    reports that she has never smoked. She has never used smokeless tobacco. She reports that she does not drink alcohol and does not use drugs.  Past Medical History:  Diagnosis Date  . Abnormal uterine bleeding   . Anxiety   . Concussion 04/05/2013   volleyball  . Elevated serum creatinine 2020  . Hyperlipidemia   . Low ferritin 2019  . Migraines   . Ovarian cyst     Past Surgical History:  Procedure Laterality Date  . CYSTOSCOPY  11/16/14   [facility]  . LAPAROSCOPY N/A 08/16/2014   Procedure: LAPAROSCOPY DIAGNOSTIC, BIOPSY OF POSTERIOR CUL DE [place];  Surgeon: Donnamae Jude, MD;  Location: [facility];  Service: Gynecology;  Laterality: N/A;  . PELVIC LAPAROSCOPY  08/2014   Dr. Shelbie Ammons for endometriosis    Current Outpatient Medications  Medication Sig Dispense Refill  . amphetamine-dextroamphetamine (ADDERALL XR) 10 MG 24 hr capsule Take 10 mg by mouth daily.    Marland Kitchen  buPROPion (WELLBUTRIN XL) 300 MG 24 hr tablet Take 300 mg by mouth every morning.    . escitalopram (LEXAPRO) 10 MG tablet Take 10 mg by mouth daily.    . ferrous sulfate 325 (65 FE) MG tablet Take 325 mg by mouth daily with breakfast.    . ibuprofen (ADVIL) 200 MG tablet Take 3 tablets by mouth 3 (three) times daily.    Marland Kitchen lamoTRIgine (LAMICTAL) 150 MG tablet Take 150 mg by mouth daily.    . Levonorgestrel (KYLEENA) 19.5 MG IUD by Intrauterine route.    . rizatriptan (MAXALT-MLT) 10 MG disintegrating tablet Take 1 tablet (10 mg total) by mouth as needed for migraine. May repeat in 2 hours if needed 10 tablet 5  . topiramate (TOPAMAX) 50 MG tablet TAKE 3 TABLETS BY MOUTH AT BEDTIME 90 tablet 0   No current facility-administered medications for this visit.    Family History  Problem Relation Age of Onset  . Cancer Paternal Grandfather        dec 69 Leukemia  . Breast cancer Paternal Grandmother 55  . Seizures Mother        Epilepsy as child  . Diabetes Maternal Grandfather   . Thyroid disease Maternal Grandmother        hyperthyroid    Review of Systems  All  other systems reviewed and are negative.   Exam:   BP 110/74 (Cuff Size: Large)   Pulse 80   Resp 16   Ht 5\' 8"  (1.727 m)   Wt 242 lb 9.6 oz (110 kg)   BMI 36.89 kg/m     General appearance: alert, cooperative and appears stated age Head: normocephalic, without obvious abnormality, atraumatic Neck: no adenopathy, supple, symmetrical, trachea midline and thyroid normal to inspection and palpation Lungs: clear to auscultation bilaterally Breasts: right - normal appearance, no masses or tenderness, No nipple retraction or dimpling, No nipple discharge or bleeding, No axillary adenopathy Left - 2 cm mass at 10:00 and 1 cm mass at 2:00.   No nipple retraction or dimpling, No nipple discharge or bleeding, No axillary adenopathy. Heart: regular rate and rhythm Abdomen: soft, non-tender; no masses, no organomegaly Extremities:  extremities normal, atraumatic, no cyanosis or edema Skin: skin color, texture, turgor normal. No rashes or lesions Lymph nodes: cervical, supraclavicular, and axillary nodes normal. Neurologic: grossly normal  Pelvic: External genitalia:  no lesions              No abnormal inguinal nodes palpated.              Urethra:  normal appearing urethra with no masses, tenderness or lesions              Bartholins and Skenes: normal                 Vagina: normal appearing vagina with normal color and discharge, no lesions              Cervix: no lesions.  IUD strings seen.               Pap taken: No. Bimanual Exam:  Uterus:  normal size, contour, position, consistency, mobility, non-tender              Adnexa: no mass, fullness, tenderness           Chaperone was present for exam.  Assessment:   Well woman visit with normal exam. Hx elevated cholesterol and triglycerides.  Left breast fibroadenomas.  STD screening.   Plan: Mammogram screening discussed. Self breast awareness reviewed. Pap and HR HPV as above. Guidelines for Calcium, Vitamin D, regular exercise program including cardiovascular and weight bearing exercise. We discussed low fat/low cholesterol diet.  Routine labs, iron, ferritin.  STD screening.  Office number given for Dr. Cristal Generous office for her to call.  Follow up annually and prn.   After visit summary provided.

## 2019-12-15 LAB — CBC
Hematocrit: 40.9 % (ref 34.0–46.6)
Hemoglobin: 13.8 g/dL (ref 11.1–15.9)
MCH: 31.6 pg (ref 26.6–33.0)
MCHC: 33.7 g/dL (ref 31.5–35.7)
MCV: 94 fL (ref 79–97)
Platelets: 241 10*3/uL (ref 150–450)
RBC: 4.37 x10E6/uL (ref 3.77–5.28)
RDW: 12.1 % (ref 11.7–15.4)
WBC: 6.5 10*3/uL (ref 3.4–10.8)

## 2019-12-15 LAB — FERRITIN: Ferritin: 80 ng/mL (ref 15–150)

## 2019-12-15 LAB — COMPREHENSIVE METABOLIC PANEL
ALT: 13 IU/L (ref 0–32)
AST: 14 IU/L (ref 0–40)
Albumin/Globulin Ratio: 2 (ref 1.2–2.2)
Albumin: 4.4 g/dL (ref 3.9–5.0)
Alkaline Phosphatase: 67 IU/L (ref 45–106)
BUN/Creatinine Ratio: 12 (ref 9–23)
BUN: 10 mg/dL (ref 6–20)
Bilirubin Total: 0.3 mg/dL (ref 0.0–1.2)
CO2: 23 mmol/L (ref 20–29)
Calcium: 9.4 mg/dL (ref 8.7–10.2)
Chloride: 104 mmol/L (ref 96–106)
Creatinine, Ser: 0.81 mg/dL (ref 0.57–1.00)
GFR calc Af Amer: 121 mL/min/{1.73_m2} (ref >59)
GFR calc non Af Amer: 105 mL/min/{1.73_m2} (ref >59)
Globulin, Total: 2.2 g/dL (ref 1.5–4.5)
Glucose: 66 mg/dL (ref 65–99)
Potassium: 4.1 mmol/L (ref 3.5–5.2)
Sodium: 141 mmol/L (ref 134–144)
Total Protein: 6.6 g/dL (ref 6.0–8.5)

## 2019-12-15 LAB — IRON: Iron: 88 ug/dL (ref 27–159)

## 2019-12-15 LAB — CERVICOVAGINAL ANCILLARY ONLY
Chlamydia: NEGATIVE
Comment: NEGATIVE
Comment: NEGATIVE
Comment: NORMAL
Neisseria Gonorrhea: NEGATIVE
Trichomonas: NEGATIVE

## 2019-12-15 LAB — LIPID PANEL
Chol/HDL Ratio: 4.7 ratio — ABNORMAL HIGH (ref 0.0–4.4)
Cholesterol, Total: 199 mg/dL (ref 100–199)
HDL: 42 mg/dL (ref >39)
LDL Chol Calc (NIH): 118 mg/dL — ABNORMAL HIGH (ref 0–99)
Triglycerides: 221 mg/dL — ABNORMAL HIGH (ref 0–149)
VLDL Cholesterol Cal: 39 mg/dL (ref 5–40)

## 2019-12-15 LAB — HEP, RPR, HIV PANEL
HIV Screen 4th Generation wRfx: NONREACTIVE
Hepatitis B Surface Ag: NEGATIVE
RPR Ser Ql: NONREACTIVE

## 2019-12-15 LAB — HEPATITIS C ANTIBODY: Hep C Virus Ab: <0.1 s/co ratio (ref 0.0–0.9)

## 2019-12-21 ENCOUNTER — Telehealth: Payer: Self-pay | Admitting: *Deleted

## 2019-12-21 NOTE — Telephone Encounter (Signed)
-----   Message from Nunzio Cobbs, MD sent at 12/17/2019  3:06 PM EDT ----- Please contact patient in follow up to her blood work.   She does have elevated cholesterol and triglycerides.  I recommend a diet low in saturated fats and cholesterol and increasing vigorous exercise.  She will need a letter written recommending this heart healthy diet to her sorority at school so that they will meet her dietary needs.  Please assist with this.  Her blood chemistries, blood counts, iron and ferritin were normal.  Her testing for infectious disease is all negative for HIV, syphilis, hepatitis B and C, trichomonas, gonorrhea, and chlamydia.

## 2019-12-21 NOTE — Telephone Encounter (Signed)
Burnice Logan, RN  12/21/2019 1:48 PM EDT Back to Top    Left message to call Sharee Pimple, RN at [facility].

## 2019-12-24 NOTE — Telephone Encounter (Signed)
Letter reviewed and signed by Dr. Quincy Simmonds.  Call returned to patient to notify. Communication sent by MyChart. Patient will notify office if she needs a signed letter. Patient thankful for call.  Encounter closed.

## 2019-12-24 NOTE — Telephone Encounter (Signed)
Spoke with patient, advised as seen below per Dr. Quincy Simmonds.  Patient will plan to print letter from Rebecca Dunn once completed.  Advised I will review with Dr. Quincy Simmonds and notify when letter is available.  Patient verbalizes understanding and is agreeable.   Letter pended for Dr. Quincy Simmonds to review.

## 2020-01-07 DIAGNOSIS — D241 Benign neoplasm of right breast: Secondary | ICD-10-CM | POA: Diagnosis not present

## 2020-01-26 DIAGNOSIS — F4010 Social phobia, unspecified: Secondary | ICD-10-CM | POA: Diagnosis not present

## 2020-01-26 DIAGNOSIS — F902 Attention-deficit hyperactivity disorder, combined type: Secondary | ICD-10-CM | POA: Diagnosis not present

## 2020-01-26 DIAGNOSIS — F411 Generalized anxiety disorder: Secondary | ICD-10-CM | POA: Diagnosis not present

## 2020-01-26 DIAGNOSIS — F339 Major depressive disorder, recurrent, unspecified: Secondary | ICD-10-CM | POA: Diagnosis not present

## 2020-02-10 ENCOUNTER — Encounter: Payer: Self-pay | Admitting: Plastic Surgery

## 2020-02-10 ENCOUNTER — Ambulatory Visit (INDEPENDENT_AMBULATORY_CARE_PROVIDER_SITE_OTHER): Payer: BC Managed Care – PPO | Admitting: Plastic Surgery

## 2020-02-10 ENCOUNTER — Other Ambulatory Visit: Payer: Self-pay

## 2020-02-10 VITALS — BP 105/62 | HR 84 | Temp 98.8°F | Ht 68.0 in | Wt 241.8 lb

## 2020-02-10 DIAGNOSIS — M545 Low back pain, unspecified: Secondary | ICD-10-CM

## 2020-02-10 DIAGNOSIS — M4004 Postural kyphosis, thoracic region: Secondary | ICD-10-CM | POA: Diagnosis not present

## 2020-02-10 DIAGNOSIS — N62 Hypertrophy of breast: Secondary | ICD-10-CM | POA: Diagnosis not present

## 2020-02-10 DIAGNOSIS — M546 Pain in thoracic spine: Secondary | ICD-10-CM | POA: Diagnosis not present

## 2020-02-10 NOTE — Progress Notes (Signed)
Referring Provider Rebecca Major, MD [address] [place],  [place] [id number]   CC:  Chief Complaint  Patient presents with  . Advice Only      Rebecca Dunn is an 19 y.o. female.  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  She was sent by Dr. Donne Dunn.  She noticed some masses on her left breast a couple years ago.  They have gradually increased in size.  She has 2 distinct masses on the left breast both medial and superior to the areola.  She has a significantly smaller lesion on the right side which is fairly close to the areola and is 2 cm in greatest dimension that was identified by ultrasound.  One of the masses on the left side has been biopsied with results showing fibroadenoma with no malignancy.  She has been interested in the breast reduction for some time.  She has back pain, neck pain and shoulder grooving related to her large breasts.  She has tried over-the-counter medications, warm packs and cold packs for her pain with little relief.  She also gets skin irritation beneath the breasts that has been refractory to over-the-counter treatments.  No Known Allergies  Outpatient Encounter Medications as of 02/10/2020  Medication Sig  . amphetamine-dextroamphetamine (ADDERALL XR) 15 MG 24 hr capsule Take 1 tablet by mouth every morning.  Marland Kitchen buPROPion (WELLBUTRIN XL) 300 MG 24 hr tablet Take 300 mg by mouth every morning.  . escitalopram (LEXAPRO) 10 MG tablet Take 10 mg by mouth daily.  . ferrous sulfate 325 (65 FE) MG tablet Take 325 mg by mouth daily with breakfast.  . ibuprofen (ADVIL) 200 MG tablet Take 3 tablets by mouth 3 (three) times daily.  Marland Kitchen lamoTRIgine (LAMICTAL) 150 MG tablet Take 150 mg by mouth daily.  . Levonorgestrel (KYLEENA) 19.5 MG IUD by Intrauterine route.  . rizatriptan (MAXALT-MLT) 10 MG disintegrating tablet Take 1 tablet (10 mg total) by mouth as needed  for migraine. May repeat in 2 hours if needed  . topiramate (TOPAMAX) 50 MG tablet TAKE 3 TABLETS BY MOUTH AT BEDTIME  . [DISCONTINUED] amphetamine-dextroamphetamine (ADDERALL XR) 10 MG 24 hr capsule Take 10 mg by mouth daily.   No facility-administered encounter medications on file as of 02/10/2020.     Past Medical History:  Diagnosis Date  . Abnormal uterine bleeding   . Anxiety   . Concussion 04/05/2013   volleyball  . Elevated serum creatinine 2020  . Hyperlipidemia   . Low ferritin 2019  . Migraines   . Ovarian cyst     Past Surgical History:  Procedure Laterality Date  . CYSTOSCOPY  11/16/14   [facility]  . LAPAROSCOPY N/A 08/16/2014   Procedure: LAPAROSCOPY DIAGNOSTIC, BIOPSY OF POSTERIOR CUL DE [place];  Surgeon: Donnamae Jude, MD;  Location: [facility];  Service: Gynecology;  Laterality: N/A;  . PELVIC LAPAROSCOPY  08/2014   Dr. Shelbie Ammons for endometriosis    Family History  Problem Relation Age of Onset  . Cancer Paternal Grandfather        dec 69 Leukemia  . Breast cancer Paternal Grandmother 85  . Seizures Mother        Epilepsy as child  . Diabetes Maternal Grandfather   . 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  She attended [employer].    She lives on campus during school.    She enjoys basketball, soccer, mission trips, and doing make-up.   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  Denies tobacco use  Review of Systems General: Denies fevers, chills, weight loss CV: Denies chest pain, shortness of breath, palpitations  Physical Exam Vitals with BMI 02/10/2020 12/14/2019 05/20/2019  Height 5\' 8"  5\' 8"  5\' 8"   Weight 241 lbs 13 oz 242 lbs 10 oz 242 lbs  BMI 36.77 06.2 69.4  Systolic [phone]  Diastolic 62 74 72  Pulse 84 80 80    General:  No acute distress,  Alert and oriented, Non-Toxic, Normal speech and affect Breast: She has  grade 3 ptosis.  Sternal notch to nipple is 35 cm bilaterally.  Nipple to fold is 13 cm bilaterally.  I am able to readily palpate the masses on the left breast which are located in the mid medially and superior to the areola.  There is no overlying skin changes. The mass on the right side is a bit more difficult to palpate for me.  The patient even has difficulty palpating the 1 on that side.  Assessment/Plan The patient has bilateral symptomatic macromastia.  She is a good candidate for a breast reduction.  She is interested in pursuing surgical treatment.  She has tried supportive garments and fitted bras with no relief.  The details of breast reduction surgery were discussed.  I explained the procedure in detail along the with the expected scars.  The risks were discussed in detail and include bleeding, infection, damage to surrounding structures, need for additional procedures, nipple loss, change in nipple sensation, persistent pain, contour irregularities and asymmetries.  I explained that breast feeding is often not possible after breast reduction surgery.  We discussed the expected postoperative course with an overall recovery period of about 1 month.  She demonstrated full understanding of all risks.  We discussed her personal risk factors that include the mass near her right nipple.  I explained that the approach and removal of this mass would put her at a higher risk for nipple areolar complex necrosis.  I been to touch base with Dr. Donne Dunn to try to get an assessment for how deep the masses has the deeper the mass the less chance of it interfering with perfusion.  Patient is highly motivated to have this all done at 1 time and I do believe it can be safely done.  She would need a inferior pedicle given the location of the masses on the left side and I can cheat that laterally just a little bit on the right side to account for the approach to the mass which is in the 3 o'clock position.  I  anticipate approximately 900g of tissue removed from each side.   Cindra Presume 02/10/2020, 4:33 PM

## 2020-02-14 ENCOUNTER — Encounter: Payer: Self-pay | Admitting: Plastic Surgery

## 2020-02-18 ENCOUNTER — Other Ambulatory Visit (INDEPENDENT_AMBULATORY_CARE_PROVIDER_SITE_OTHER): Payer: Self-pay | Admitting: Pediatrics

## 2020-02-18 DIAGNOSIS — G43009 Migraine without aura, not intractable, without status migrainosus: Secondary | ICD-10-CM

## 2020-02-24 DIAGNOSIS — F411 Generalized anxiety disorder: Secondary | ICD-10-CM | POA: Diagnosis not present

## 2020-02-24 DIAGNOSIS — F4010 Social phobia, unspecified: Secondary | ICD-10-CM | POA: Diagnosis not present

## 2020-02-24 DIAGNOSIS — F339 Major depressive disorder, recurrent, unspecified: Secondary | ICD-10-CM | POA: Diagnosis not present

## 2020-02-24 DIAGNOSIS — F902 Attention-deficit hyperactivity disorder, combined type: Secondary | ICD-10-CM | POA: Diagnosis not present

## 2020-02-25 ENCOUNTER — Other Ambulatory Visit: Payer: Self-pay | Admitting: General Surgery

## 2020-03-14 ENCOUNTER — Other Ambulatory Visit (INDEPENDENT_AMBULATORY_CARE_PROVIDER_SITE_OTHER): Payer: Self-pay | Admitting: Pediatrics

## 2020-03-14 DIAGNOSIS — G43009 Migraine without aura, not intractable, without status migrainosus: Secondary | ICD-10-CM

## 2020-03-28 ENCOUNTER — Other Ambulatory Visit (INDEPENDENT_AMBULATORY_CARE_PROVIDER_SITE_OTHER): Payer: Self-pay | Admitting: Pediatrics

## 2020-03-28 DIAGNOSIS — G43009 Migraine without aura, not intractable, without status migrainosus: Secondary | ICD-10-CM

## 2020-04-02 NOTE — Progress Notes (Signed)
ICD-10-CM   1. Macromastia  N62   2. Back pain of thoracolumbar region  M54.50    M54.6   3. Postural kyphosis, thoracic region  M40.04       Patient ID: Rebecca Dunn, female    DOB: 02/08/2001, 19 y.o.   MRN: [id number]   History of Present Illness: Rebecca Dunn is a 19 y.o.  female  with a history of macromastia and breast masses.  She presents for preoperative evaluation for upcoming procedure, breast mass excisions with Dr. Donne Hazel and bilateral breast reduction with Dr. Claudia Desanctis, scheduled for 04/24/2020.  Summary from previous visit: Patient is planning for excision of fibroadenomas with Dr. Donne Hazel.  She would like a bilateral breast reduction at the same time.  She has grade 3 ptosis.  Sternal notch to nipple distance is 35 cm bilaterally.  Nipple to IMF is 13 cm bilaterally.  Palpable masses on the left breast located at mid medially and superior to the areola.  Mass on the right side is difficult to palpate.  Estimated excess breast tissue to be removed at time of surgery is 900 g from each side  Job: Psychologist, sport and exercise - Marketing  PMH Significant for: Anxiety, HLD, migraines, ovarian cyst  The patient has had problems with anesthesia. Post Op Nausea  Past Medical History: Allergies: No Known Allergies  Current Medications:  Current Outpatient Medications:  .  amphetamine-dextroamphetamine (ADDERALL XR) 15 MG 24 hr capsule, Take 1 tablet by mouth every morning., Disp: , Rfl:  .  buPROPion (WELLBUTRIN XL) 300 MG 24 hr tablet, Take 300 mg by mouth every morning., Disp: , Rfl:  .  escitalopram (LEXAPRO) 10 MG tablet, Take 10 mg by mouth daily., Disp: , Rfl:  .  ferrous sulfate 325 (65 FE) MG tablet, Take 325 mg by mouth daily with breakfast., Disp: , Rfl:  .  HYDROcodone-acetaminophen (NORCO) 5-325 MG tablet, Take 1 tablet by mouth every 8 (eight) hours as needed for up to 7 days for severe pain. For use AFTER Surgery, Disp: 21 tablet, Rfl: 0 .  ibuprofen  (ADVIL) 200 MG tablet, Take 3 tablets by mouth 3 (three) times daily., Disp: , Rfl:  .  lamoTRIgine (LAMICTAL) 150 MG tablet, Take 150 mg by mouth daily., Disp: , Rfl:  .  Levonorgestrel (KYLEENA) 19.5 MG IUD, by Intrauterine route., Disp: , Rfl:  .  ondansetron (ZOFRAN) 4 MG tablet, Take 1 tablet (4 mg total) by mouth every 8 (eight) hours as needed for nausea or vomiting., Disp: 20 tablet, Rfl: 0 .  rizatriptan (MAXALT-MLT) 10 MG disintegrating tablet, Take 1 tablet (10 mg total) by mouth as needed for migraine. May repeat in 2 hours if needed, Disp: 10 tablet, Rfl: 5 .  topiramate (TOPAMAX) 50 MG tablet, TAKE 3 TABLETS BY MOUTH AT BEDTIME, Disp: 90 tablet, Rfl: 0  Past Medical Problems: Past Medical History:  Diagnosis Date  . Abnormal uterine bleeding   . Anxiety   . Concussion 04/05/2013   volleyball  . Elevated serum creatinine 2020  . Hyperlipidemia   . Low ferritin 2019  . Migraines   . Ovarian cyst     Past Surgical History: Past Surgical History:  Procedure Laterality Date  . CYSTOSCOPY  11/16/14   [facility]  . LAPAROSCOPY N/A 08/16/2014   Procedure: LAPAROSCOPY DIAGNOSTIC, BIOPSY OF POSTERIOR CUL DE [place];  Surgeon: Donnamae Jude, MD;  Location: [facility];  Service: Gynecology;  Laterality: N/A;  . PELVIC LAPAROSCOPY  08/2014  Dr. Shelbie Ammons for endometriosis    Social History: Social History   Socioeconomic History  . Marital status: Single    Spouse name: Not on file  . Number of children: Not on file  . Years of education: Not on file  . Highest education level: Not on file  Occupational History  . Not on file  Tobacco Use  . Smoking status: Never Smoker  . Smokeless tobacco: Never Used  Vaping Use  . Vaping Use: Never used  Substance and Sexual Activity  . Alcohol use: No    Alcohol/week: 0.0 standard drinks  . Drug use: No  . Sexual activity: Yes    Birth control/protection: Pill    Comment: Kyleena IUD 04-19-19  Other Topics Concern  . 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   She attended [employer].    She lives on campus during school.    She enjoys basketball, soccer, mission trips, and doing make-up.   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   Social Determinants of Health   Financial Resource Strain:   . Difficulty of Paying Living Expenses: Not on file  Food Insecurity:   . Worried About Charity fundraiser in the Last Year: Not on file  . Ran Out of Food in the Last Year: Not on file  Transportation Needs:   . Lack of Transportation (Medical): Not on file  . Lack of Transportation (Non-Medical): Not on file  Physical Activity:   . Days of Exercise per Week: Not on file  . Minutes of Exercise per Session: Not on file  Stress:   . Feeling of Stress : Not on file  Social Connections:   . Frequency of Communication with Friends and Family: Not on file  . Frequency of Social Gatherings with Friends and Family: Not on file  . Attends Religious Services: Not on file  . Active Member of Clubs or Organizations: Not on file  . Attends Archivist Meetings: Not on file  . Marital Status: Not on file  Intimate Partner Violence:   . Fear of Current or Ex-Partner: Not on file  . Emotionally Abused: Not on file  . Physically Abused: Not on file  . Sexually Abused: Not on file    Family History: Family History  Problem Relation Age of Onset  . Cancer Paternal Grandfather        dec 69 Leukemia  . Breast cancer Paternal Grandmother 37  . Seizures Mother        Epilepsy as child  . Diabetes Maternal Grandfather   . Thyroid disease Maternal Grandmother        hyperthyroid    Review of Systems: Review of Systems  Constitutional: Negative for chills and fever.  HENT: Negative for congestion and sore throat.   Respiratory: Negative for cough and shortness of breath.   Cardiovascular: Negative for chest pain.  Gastrointestinal: Negative for abdominal  pain, nausea and vomiting.  Musculoskeletal: Positive for back pain and neck pain.  Skin: Negative for itching and rash.    Physical Exam: Vital Signs BP 136/84 (BP Location: Left Arm, Patient Position: Sitting, Cuff Size: Large)   Pulse 82   Temp (!) 97.5 F (36.4 C) (Temporal)   Ht 5\' 8"  (1.727 m)   Wt 240 lb (108.9 kg)   LMP 03/22/2020 (Exact Date)   SpO2 99%   BMI 36.49 kg/m  Physical Exam Vitals  and nursing note reviewed.  Constitutional:      General: She is not in acute distress.    Appearance: Normal appearance. She is obese. She is not ill-appearing.  HENT:     Head: Normocephalic and atraumatic.  Eyes:     Extraocular Movements: Extraocular movements intact.  Cardiovascular:     Rate and Rhythm: Normal rate and regular rhythm.     Pulses: Normal pulses.     Heart sounds: Normal heart sounds.  Pulmonary:     Effort: Pulmonary effort is normal.     Breath sounds: Normal breath sounds. No wheezing, rhonchi or rales.  Abdominal:     General: Bowel sounds are normal.     Palpations: Abdomen is soft.  Musculoskeletal:        General: No swelling. Normal range of motion.     Cervical back: Normal range of motion.  Skin:    General: Skin is warm and dry.     Coloration: Skin is not pale.     Findings: No erythema or rash.  Neurological:     General: No focal deficit present.     Mental Status: She is alert and oriented to person, place, and time.  Psychiatric:        Mood and Affect: Mood normal.        Behavior: Behavior normal.        Thought Content: Thought content normal.        Judgment: Judgment normal.     Assessment/Plan:  Ms. Dunn scheduled for excision of breast mass x2 with Dr. Donne Hazel and bilateral breast reduction with Dr. Claudia Desanctis.  Risks, benefits, and alternatives of procedure discussed, questions answered and consent obtained.    Smoking Status: non-smoker; Counseling Given? N/A Last Mammogram: N/A 19 years old; ultrasounds of bilateral  breasts  Caprini Score: Moderate; Risk Factors include: 19 year old female, BMI > 25, and length of planned surgery. Recommendation for mechanical or pharmacological prophylaxis during surgery. Encourage early ambulation.   Pictures obtained: 02/10/2020  Post-op Rx sent to pharmacy: [facility], [place]  Patient was provided with the breast reduction risks and General Surgical Risk consent document and Pain Medication Agreement prior to their appointment.  They had adequate time to read through the risk consent documents and Pain Medication Agreement. We also discussed them in person together during this preop appointment. All of their questions were answered to their satisfaction.  Recommended calling if they have any further questions.  Risk consent form and Pain Medication Agreement to be scanned into patient's chart.  The risk that can be encountered with breast reduction were discussed and include the following but not limited to these:  Breast asymmetry, fluid accumulation, firmness of the breast, inability to breast feed, loss of nipple or areola, skin loss, decrease or no nipple sensation, fat necrosis of the breast tissue, bleeding, infection, healing delay.  There are risks of anesthesia, changes to skin sensation and injury to nerves or blood vessels.  The muscle can be temporarily or permanently injured.  You may have an allergic reaction to tape, suture, glue, blood products which can result in skin discoloration, swelling, pain, skin lesions, poor healing.  Any of these can lead to the need for revisonal surgery or stage procedures.  A reduction has potential to interfere with diagnostic procedures.  Nipple or breast piercing can increase risks of infection.  This procedure is best done when the breast is fully developed.  Changes in the breast will continue to occur over time.  Pregnancy can alter the outcomes of previous breast reduction surgery, weight gain and weigh loss can also effect the long  term appearance.   Electronically signed by: Threasa Heads, PA-C 04/05/2020 12:46 PM

## 2020-04-05 ENCOUNTER — Encounter: Payer: Self-pay | Admitting: Plastic Surgery

## 2020-04-05 ENCOUNTER — Other Ambulatory Visit: Payer: Self-pay

## 2020-04-05 ENCOUNTER — Ambulatory Visit: Payer: BC Managed Care – PPO | Admitting: Plastic Surgery

## 2020-04-05 VITALS — BP 136/84 | HR 82 | Temp 97.5°F | Ht 68.0 in | Wt 240.0 lb

## 2020-04-05 DIAGNOSIS — M545 Low back pain, unspecified: Secondary | ICD-10-CM

## 2020-04-05 DIAGNOSIS — M546 Pain in thoracic spine: Secondary | ICD-10-CM

## 2020-04-05 DIAGNOSIS — N62 Hypertrophy of breast: Secondary | ICD-10-CM

## 2020-04-05 DIAGNOSIS — M4004 Postural kyphosis, thoracic region: Secondary | ICD-10-CM

## 2020-04-05 MED ORDER — HYDROCODONE-ACETAMINOPHEN 5-325 MG PO TABS: 1.0000 | ORAL_TABLET | Freq: Three times a day (TID) | ORAL | 0 refills | Status: AC | PRN

## 2020-04-05 MED ORDER — ONDANSETRON HCL 4 MG PO TABS: 4.0000 mg | ORAL_TABLET | Freq: Three times a day (TID) | ORAL | 0 refills | Status: DC | PRN

## 2020-04-12 HISTORY — PX: BREAST REDUCTION SURGERY: SHX8

## 2020-04-21 ENCOUNTER — Other Ambulatory Visit (HOSPITAL_COMMUNITY): Payer: BC Managed Care – PPO

## 2020-04-21 IMAGING — US ULTRASOUND RIGHT BREAST LIMITED
1 series · 5 of 5 positions shown · non-contrast
Comparison: 12/16/2016

CLINICAL DATA: Patient presents for first six-month follow-up of
bilateral probable fibroadenomas.

EXAM:
ULTRASOUND OF THE BILATERAL BREAST

[Series 1: ultrasound right breast limited · 0.07mm/px · 5 of 5 slices shown]
[im 1/5]
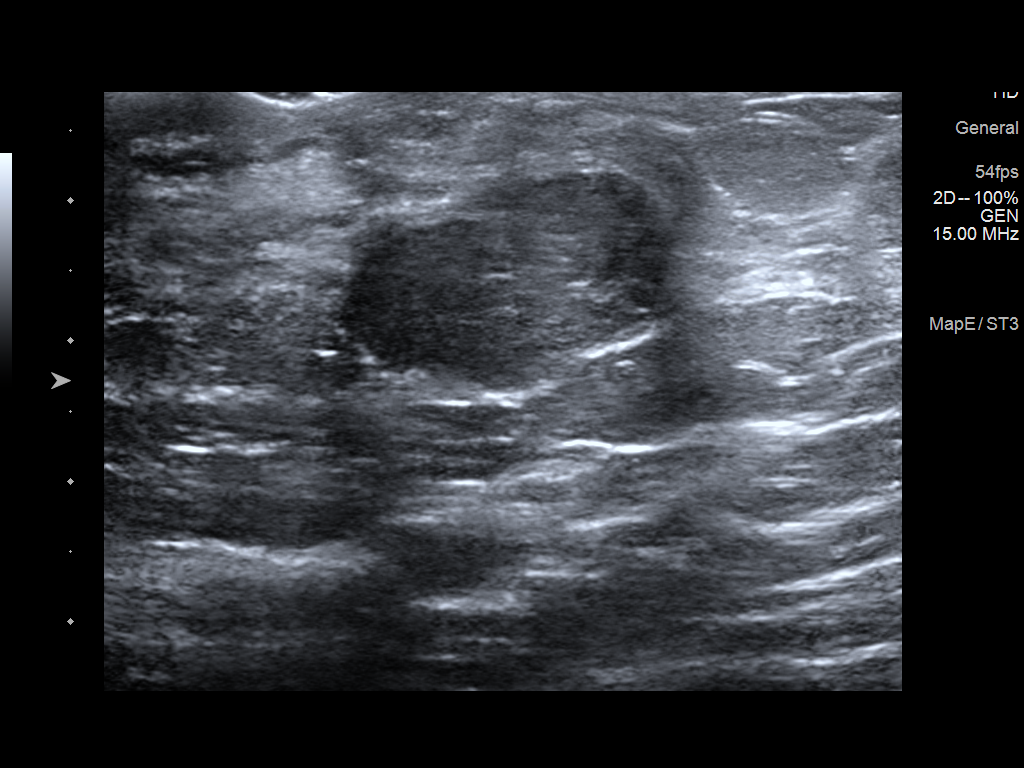
[im 2/5]
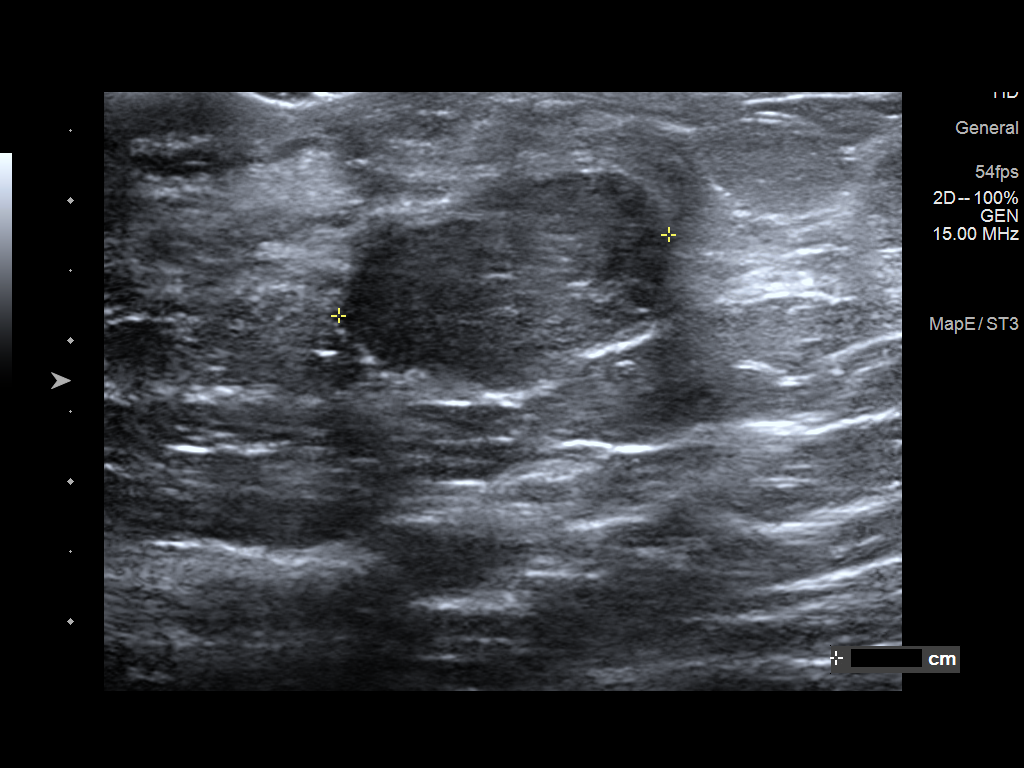
[im 3/5]
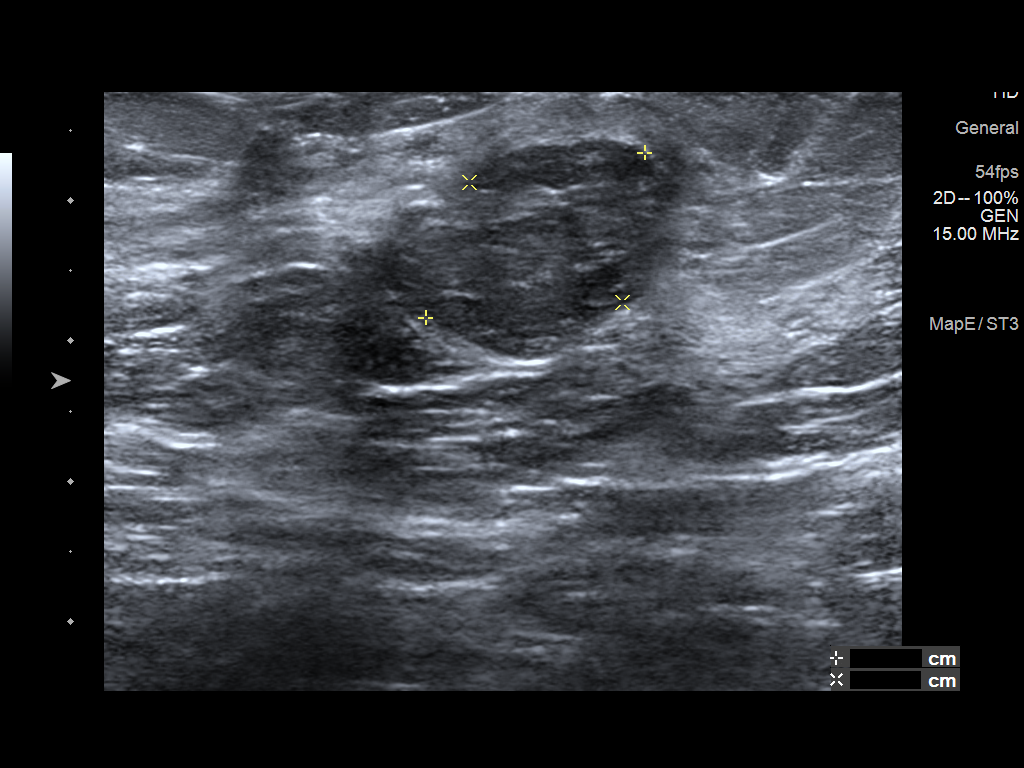
[im 4/5]
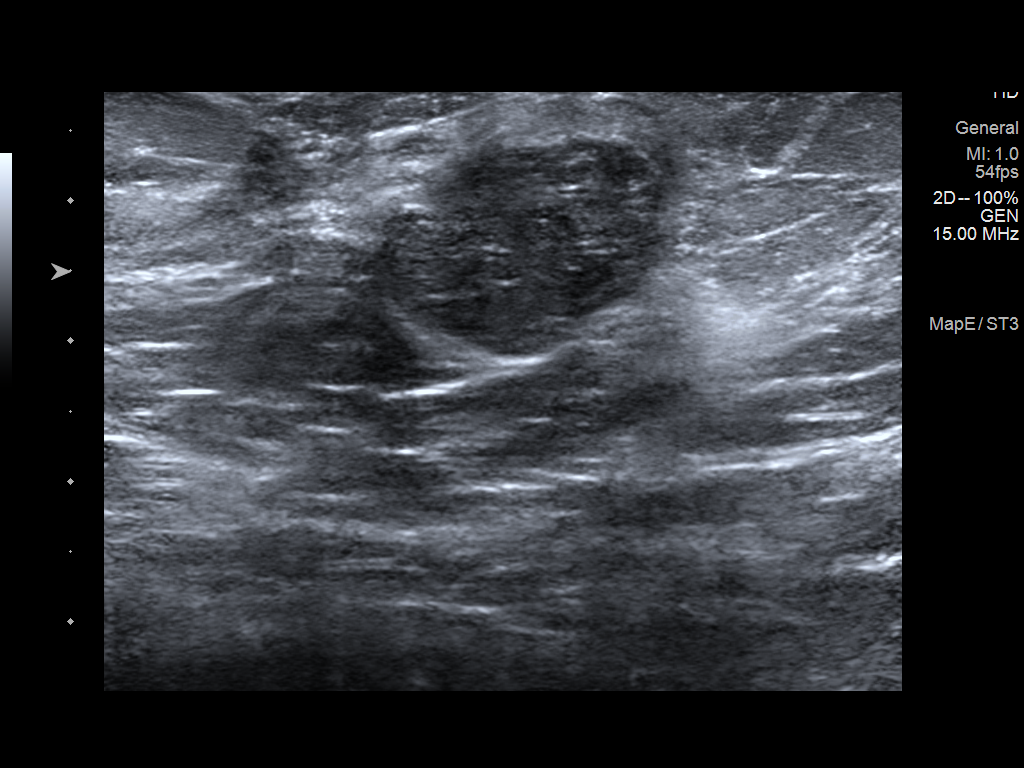
[im 5/5]
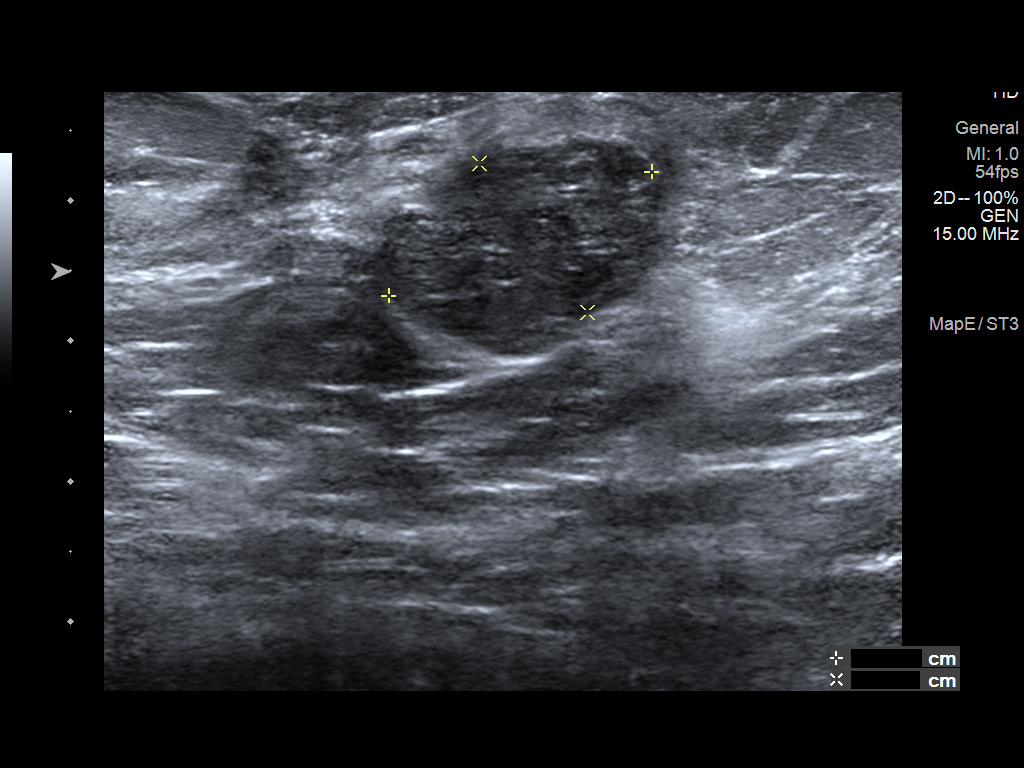

[5 of 5 positions shown; findings below may reference images not displayed]

FINDINGS: RIGHT breast:

Targeted ultrasound is performed, showing a circumscribed oval
hypoechoic parallel mass in the 3 o'clock location of the RIGHT
breast 1 centimeter from the nipple which measures 2.4 x 2.1 x
centimeters. Previously mass measured 2.0 x 1.9 x 1.4 centimeters.

LEFT breast:

Targeted ultrasound is performed, showing a circumscribed oval mass
in the 12 o'clock location of the LEFT breast 3 centimeters from the
nipple measuring 1.7 x 1.4 x 1.7 centimeter. Previously this mass
measured 1.5 x 1.3 x 1.4 centimeters

In the 9:30 o'clock location 5 centimeters from nipple, an oval
circumscribed hypoechoic parallel mass is 2.7 x 1.9 x
centimeters. A small hypoechoic mass posterior to this is 1.5 x
centimeters. Previously this mass measured 3.4 x 2.8 x
centimeters. The small posterior component measured 2.2 x
centimeters.
IMPRESSION: 1. Lesion RIGHT breast is slightly larger but with similar
morphology and ultrasound appearance in consistent with benign
fibroadenoma.
2. Lesion in the 9:30 o'clock location of the RIGHT breast is
slightly smaller compared with previous exam.
3. Lesion in the 12 o'clock location of the LEFT breast is slightly
larger but with similar morphology and ultrasound appearance
favoring fibroadenoma.

RECOMMENDATION:
We discussed the option of ultrasound-guided core biopsy. However
the patient and her mother are comfortable with follow-up. Recommend
follow-up bilateral breast ultrasound in 6 months.

I have discussed the findings and recommendations with the patient
and her mother. Results were also provided in writing at the
conclusion of the visit. If applicable, a reminder letter will be
sent to the patient regarding the next appointment.

BI-RADS CATEGORY  3: Probably benign.

## 2020-04-21 IMAGING — US ULTRASOUND LEFT BREAST LIMITED
1 series · 13 of 14 positions shown · non-contrast
Comparison: 12/16/2016

CLINICAL DATA: Patient presents for first six-month follow-up of
bilateral probable fibroadenomas.

EXAM:
ULTRASOUND OF THE BILATERAL BREAST

[Series 1: ultrasound left breast limited · 0.07mm/px · 13 of 14 slices shown]
[im 1/14]
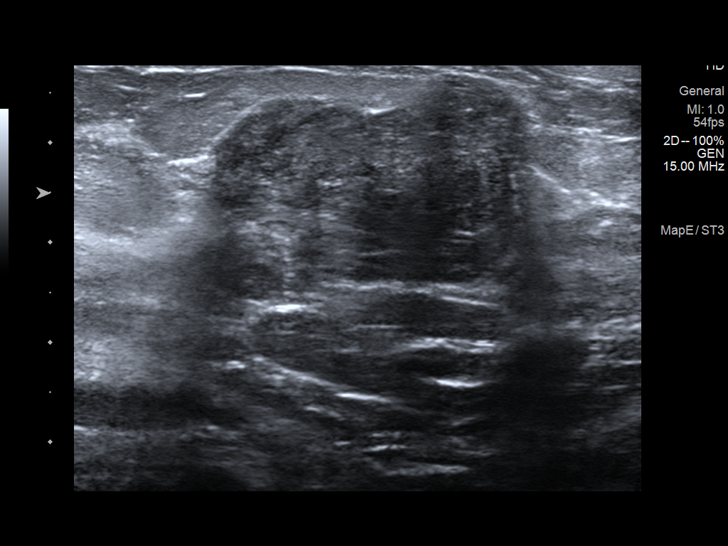
[im 2/14]
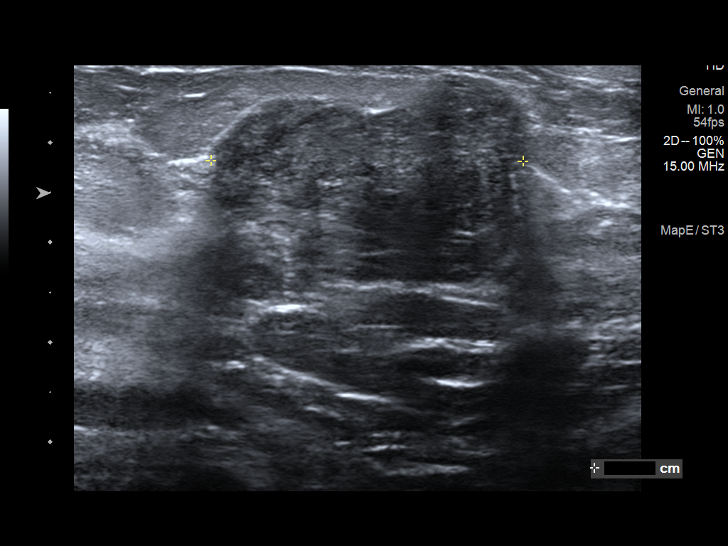
[im 3/14]
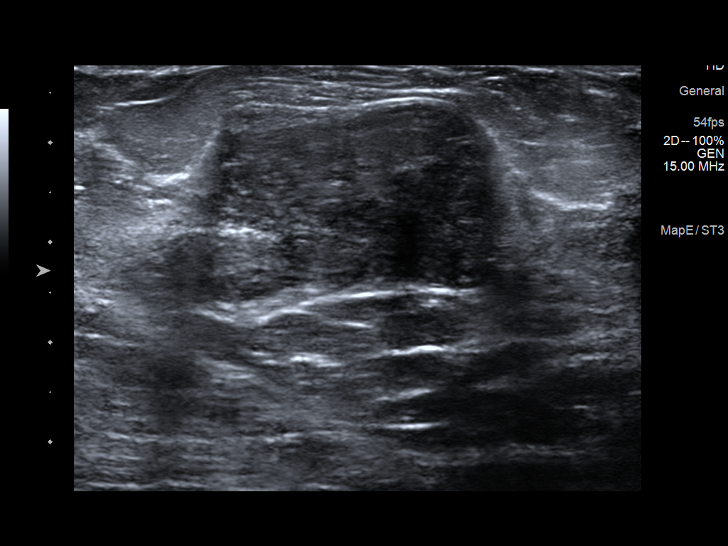
[im 4/14]
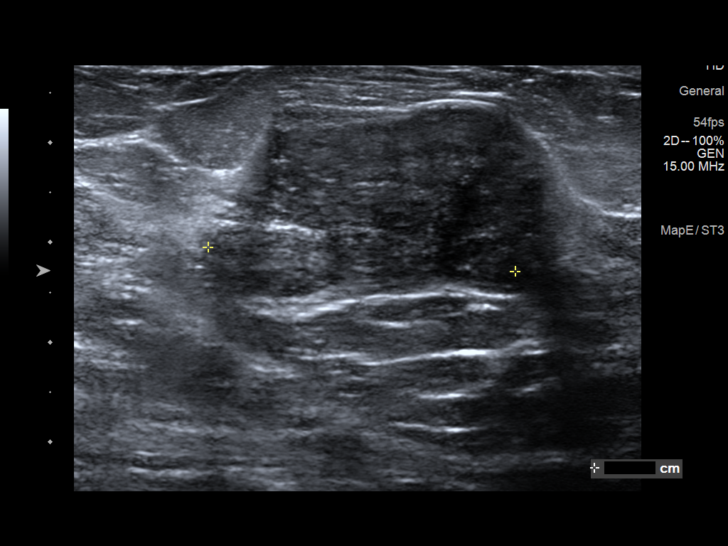
[im 5/14]
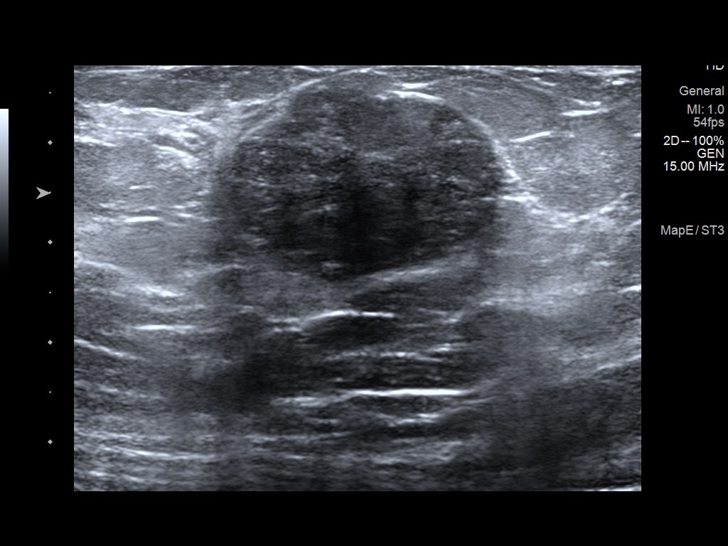
[im 6/14]
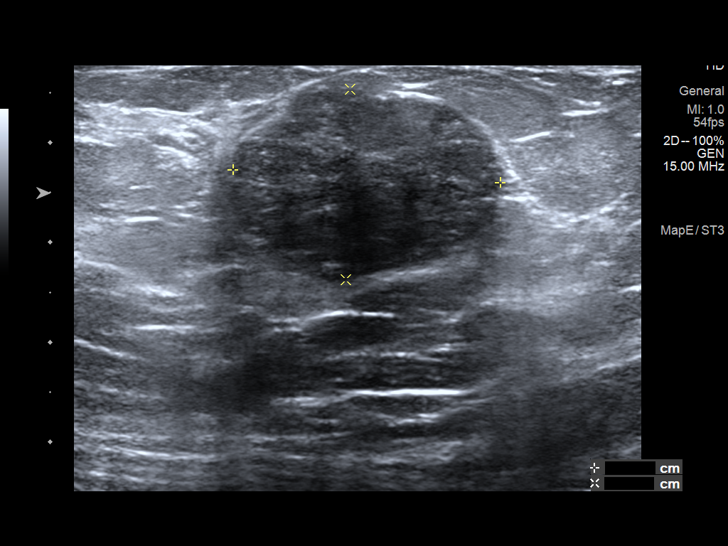
[im 8/14]
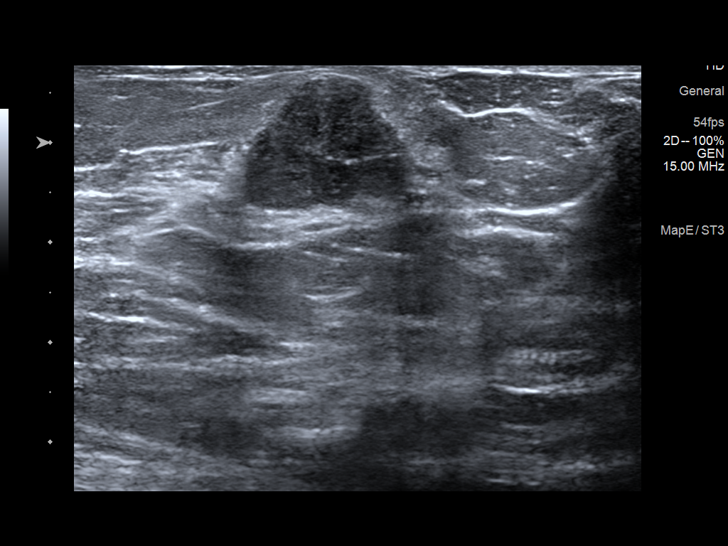
[im 9/14]
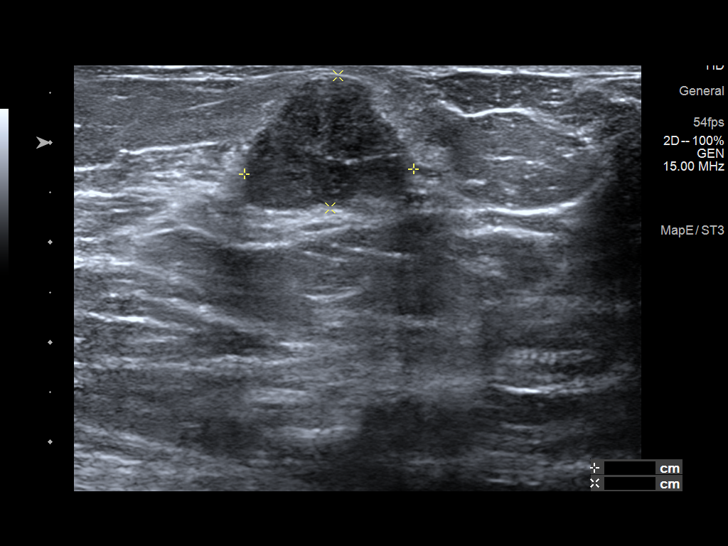
[im 10/14]
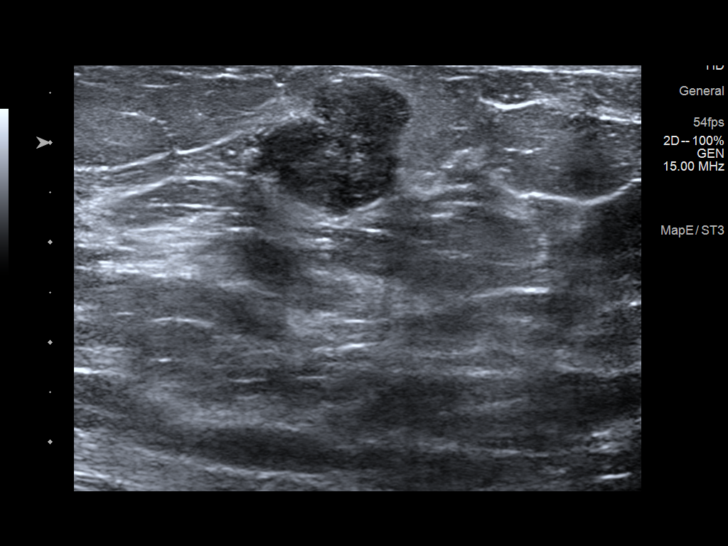
[im 11/14]
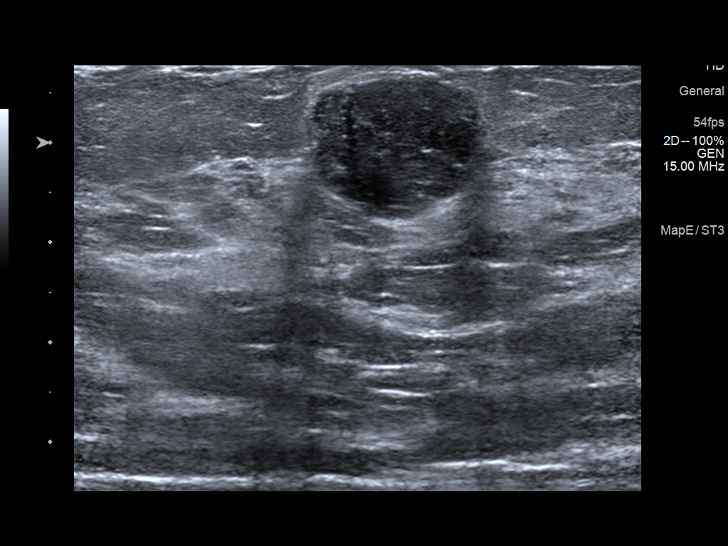
[im 12/14]
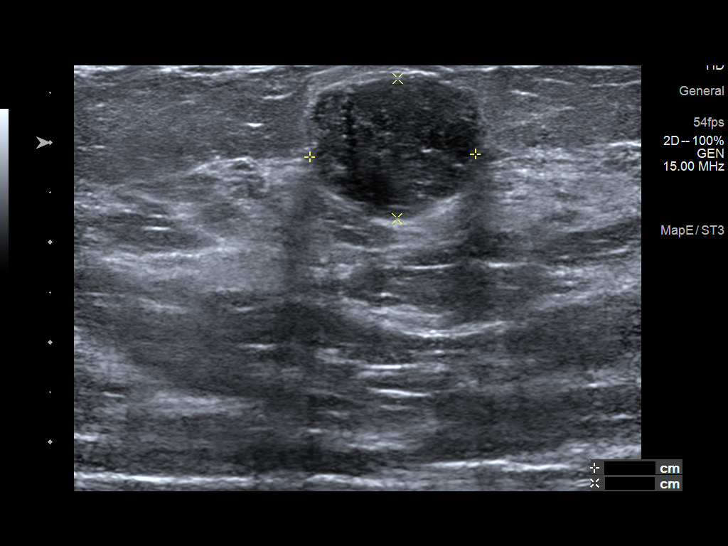
[im 13/14]
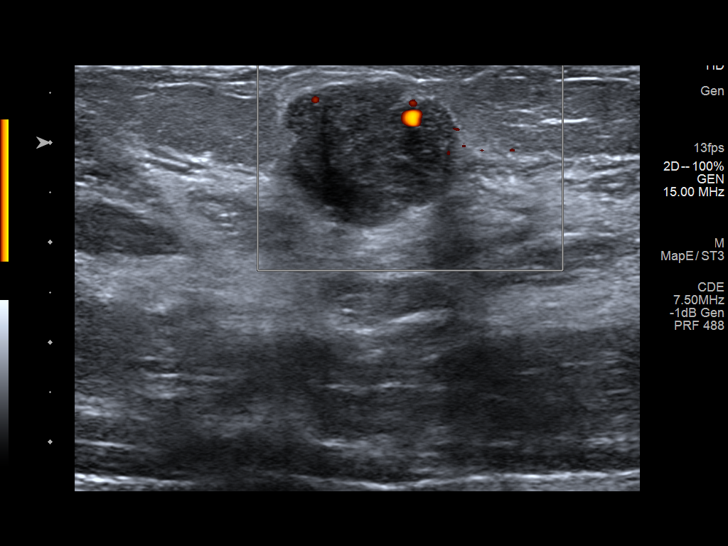
[im 14/14]
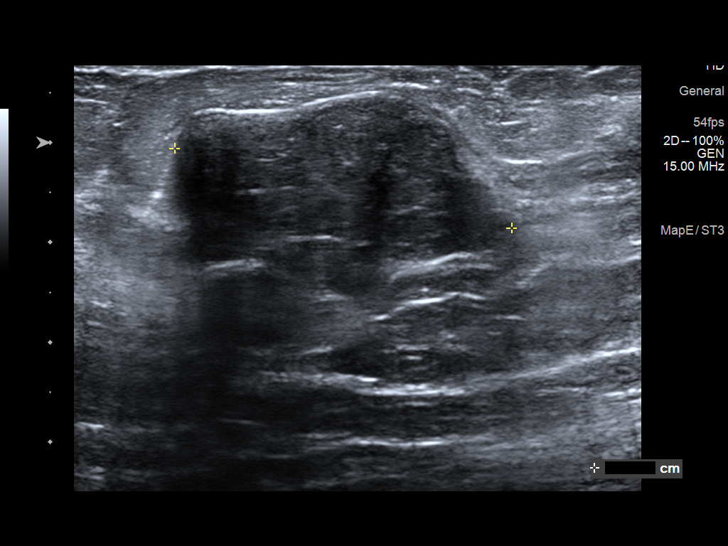

[13 of 14 positions shown; findings below may reference images not displayed]

FINDINGS: RIGHT breast:

Targeted ultrasound is performed, showing a circumscribed oval
hypoechoic parallel mass in the 3 o'clock location of the RIGHT
breast 1 centimeter from the nipple which measures 2.4 x 2.1 x
centimeters. Previously mass measured 2.0 x 1.9 x 1.4 centimeters.

LEFT breast:

Targeted ultrasound is performed, showing a circumscribed oval mass
in the 12 o'clock location of the LEFT breast 3 centimeters from the
nipple measuring 1.7 x 1.4 x 1.7 centimeter. Previously this mass
measured 1.5 x 1.3 x 1.4 centimeters

In the 9:30 o'clock location 5 centimeters from nipple, an oval
circumscribed hypoechoic parallel mass is 2.7 x 1.9 x
centimeters. A small hypoechoic mass posterior to this is 1.5 x
centimeters. Previously this mass measured 3.4 x 2.8 x
centimeters. The small posterior component measured 2.2 x
centimeters.
IMPRESSION: 1. Lesion RIGHT breast is slightly larger but with similar
morphology and ultrasound appearance in consistent with benign
fibroadenoma.
2. Lesion in the 9:30 o'clock location of the RIGHT breast is
slightly smaller compared with previous exam.
3. Lesion in the 12 o'clock location of the LEFT breast is slightly
larger but with similar morphology and ultrasound appearance
favoring fibroadenoma.

RECOMMENDATION:
We discussed the option of ultrasound-guided core biopsy. However
the patient and her mother are comfortable with follow-up. Recommend
follow-up bilateral breast ultrasound in 6 months.

I have discussed the findings and recommendations with the patient
and her mother. Results were also provided in writing at the
conclusion of the visit. If applicable, a reminder letter will be
sent to the patient regarding the next appointment.

BI-RADS CATEGORY  3: Probably benign.

## 2020-04-24 ENCOUNTER — Encounter (HOSPITAL_BASED_OUTPATIENT_CLINIC_OR_DEPARTMENT_OTHER): Payer: Self-pay

## 2020-04-24 ENCOUNTER — Ambulatory Visit (HOSPITAL_BASED_OUTPATIENT_CLINIC_OR_DEPARTMENT_OTHER): Admit: 2020-04-24 | Payer: BC Managed Care – PPO | Admitting: Plastic Surgery

## 2020-04-24 SURGERY — MAMMOPLASTY, REDUCTION
Anesthesia: General | Site: Breast | Laterality: Right

## 2020-04-27 DIAGNOSIS — Z01818 Encounter for other preprocedural examination: Secondary | ICD-10-CM | POA: Diagnosis not present

## 2020-04-28 DIAGNOSIS — Z719 Counseling, unspecified: Secondary | ICD-10-CM

## 2020-05-01 ENCOUNTER — Other Ambulatory Visit: Payer: Self-pay | Admitting: Plastic Surgery

## 2020-05-01 ENCOUNTER — Telehealth: Payer: Self-pay

## 2020-05-01 ENCOUNTER — Other Ambulatory Visit: Payer: Self-pay | Admitting: General Surgery

## 2020-05-01 DIAGNOSIS — N6032 Fibrosclerosis of left breast: Secondary | ICD-10-CM | POA: Diagnosis not present

## 2020-05-01 DIAGNOSIS — N6322 Unspecified lump in the left breast, upper inner quadrant: Secondary | ICD-10-CM | POA: Diagnosis not present

## 2020-05-01 DIAGNOSIS — N62 Hypertrophy of breast: Secondary | ICD-10-CM | POA: Diagnosis not present

## 2020-05-01 DIAGNOSIS — D242 Benign neoplasm of left breast: Secondary | ICD-10-CM | POA: Diagnosis not present

## 2020-05-01 DIAGNOSIS — N6031 Fibrosclerosis of right breast: Secondary | ICD-10-CM | POA: Diagnosis not present

## 2020-05-01 NOTE — Telephone Encounter (Signed)
Patient's Mom is calling to say that her daughter Rebecca Dunn - patient) is in pain.  She had surgery this morning with Dr. Claudia Desanctis and they gave her oxycodone at 11am.  She would like to know when she can give [facility] something else for pain.  Please call.

## 2020-05-02 NOTE — Telephone Encounter (Signed)
Call returned to pt's Mom in regards to her question about pain medication schedule & alternative to narcotics.  Mom states that the surgery center gave the patient a Oxycodone @ 11 am before she was discharged.  She was not sure as to when she could take another pain med. I consulted with Venetia Night, [place]- she recommended that pt can take the following meds as follows: OTC- Ibuprofen 600mg / Q 6hrs OTC- Tylenol 500mg / Q 6hrs Hydrocodone Q 8 hrs Per Venetia Night- the pt can take her 1st dose of Hydrocodone @5pm  today The mother agrees with the plan of care & will call for any concerns.

## 2020-05-03 ENCOUNTER — Encounter: Payer: BC Managed Care – PPO | Admitting: Plastic Surgery

## 2020-05-10 ENCOUNTER — Other Ambulatory Visit: Payer: Self-pay

## 2020-05-10 ENCOUNTER — Ambulatory Visit (INDEPENDENT_AMBULATORY_CARE_PROVIDER_SITE_OTHER): Payer: BC Managed Care – PPO | Admitting: Plastic Surgery

## 2020-05-10 DIAGNOSIS — B360 Pityriasis versicolor: Secondary | ICD-10-CM | POA: Diagnosis not present

## 2020-05-10 DIAGNOSIS — N62 Hypertrophy of breast: Secondary | ICD-10-CM

## 2020-05-10 DIAGNOSIS — L700 Acne vulgaris: Secondary | ICD-10-CM | POA: Diagnosis not present

## 2020-05-10 NOTE — Progress Notes (Signed)
Patient presents about 1 week out from bilateral breast reduction.  She feels good and is happy.  On exam everything looks to be healing fine with intact incisions and viable nipple areolar complexes.  There is no subcutaneous fluid.  I have asked her to continue compressive garments and avoid strenuous activity.  We will see her again in a few weeks.

## 2020-05-11 ENCOUNTER — Encounter: Payer: BC Managed Care – PPO | Admitting: Plastic Surgery

## 2020-05-17 ENCOUNTER — Telehealth: Payer: Self-pay

## 2020-05-17 ENCOUNTER — Encounter: Payer: BC Managed Care – PPO | Admitting: Surgical

## 2020-05-17 NOTE — Telephone Encounter (Signed)
Patient had bilateral breast reduction surgery on 12/20 with Dr. Arita Miss.  She is calling because the tape from her incision came off and she noticed some white puss.  Please call.

## 2020-05-18 DIAGNOSIS — Z20822 Contact with and (suspected) exposure to covid-19: Secondary | ICD-10-CM | POA: Diagnosis not present

## 2020-05-19 NOTE — Telephone Encounter (Signed)
Called and spoke with the patient on (05/17/20) regarding the message below.  She stated that the drainage is very little and not bad.  She said she just have not seen that before.  Informed the patient that it's okay if the tape comes off, it will continue to come off, and she will have a little drainage and that's okay.  Informed her that she can put some Vaseline on the area where she sees the drainage.  Patient verbalized understanding and agreed.//AB/CMA

## 2020-06-01 DIAGNOSIS — F902 Attention-deficit hyperactivity disorder, combined type: Secondary | ICD-10-CM | POA: Diagnosis not present

## 2020-06-01 DIAGNOSIS — F339 Major depressive disorder, recurrent, unspecified: Secondary | ICD-10-CM | POA: Diagnosis not present

## 2020-06-01 DIAGNOSIS — F411 Generalized anxiety disorder: Secondary | ICD-10-CM | POA: Diagnosis not present

## 2020-06-01 DIAGNOSIS — F4010 Social phobia, unspecified: Secondary | ICD-10-CM | POA: Diagnosis not present

## 2020-06-15 ENCOUNTER — Other Ambulatory Visit: Payer: Self-pay

## 2020-06-15 ENCOUNTER — Encounter: Payer: Self-pay | Admitting: Surgical

## 2020-06-15 ENCOUNTER — Ambulatory Visit (INDEPENDENT_AMBULATORY_CARE_PROVIDER_SITE_OTHER): Payer: BC Managed Care – PPO | Admitting: Surgical

## 2020-06-15 VITALS — BP 113/70 | HR 98

## 2020-06-15 DIAGNOSIS — N62 Hypertrophy of breast: Secondary | ICD-10-CM

## 2020-06-15 DIAGNOSIS — Z9889 Other specified postprocedural states: Secondary | ICD-10-CM

## 2020-06-15 NOTE — Progress Notes (Signed)
Patient is a 20 year old female here for follow-up after bilateral breast reduction with Dr. Claudia Desanctis.  She is 5 weeks postop. She reports that she is doing well. She did notice that she had a suture protruding through the skin on 1 portion of her breast, she reports she cut this herself without any issue.  Chaperone present on exam On exam bilateral NAC's are viable, bilateral breast incisions are intact. She does have 2 small scabs on the left breast. One is on the vertical limb and one is on the NAC. There is no erythema. Bilateral breasts are symmetric. There is no wounds noted. No subcutaneous fluid collections  Recommend continue her compressive garment for a few more weeks, recommend slowly working her way back to normal activity. She can begin wearing a normal bra without an underwire soon. We discussed use of scar creams today, skinuva versus Mederma. All of her questions were answered. We discussed scheduling an additional follow-up in a few months or following up as needed, patient is comfortable following up on an as-needed basis. She is doing really well.  No sign of seroma, hematoma, infection.

## 2020-09-07 DIAGNOSIS — F4010 Social phobia, unspecified: Secondary | ICD-10-CM | POA: Diagnosis not present

## 2020-09-07 DIAGNOSIS — F411 Generalized anxiety disorder: Secondary | ICD-10-CM | POA: Diagnosis not present

## 2020-09-07 DIAGNOSIS — F902 Attention-deficit hyperactivity disorder, combined type: Secondary | ICD-10-CM | POA: Diagnosis not present

## 2020-09-07 DIAGNOSIS — F339 Major depressive disorder, recurrent, unspecified: Secondary | ICD-10-CM | POA: Diagnosis not present

## 2020-09-14 ENCOUNTER — Encounter (INDEPENDENT_AMBULATORY_CARE_PROVIDER_SITE_OTHER): Payer: Self-pay

## 2020-10-17 IMAGING — US US BREAST*R* LIMITED INC AXILLA
1 series · 4 of 4 positions shown · non-contrast
Comparison: Previous exam(s).

CLINICAL DATA: Follow-up of probably benign bilateral breast
masses.

EXAM:
ULTRASOUND OF THE BILATERAL BREAST

[Series 1: us breast*right* limited inc axilla · 0.06mm/px · 4 of 4 slices shown]
[im 1/4]
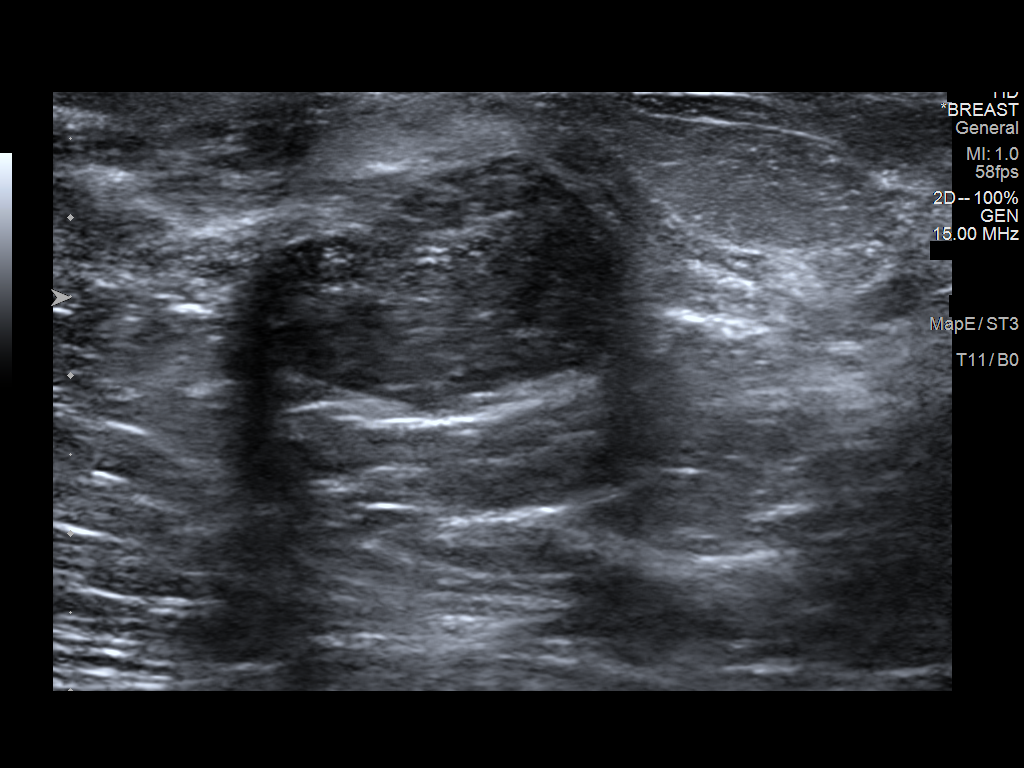
[im 2/4]
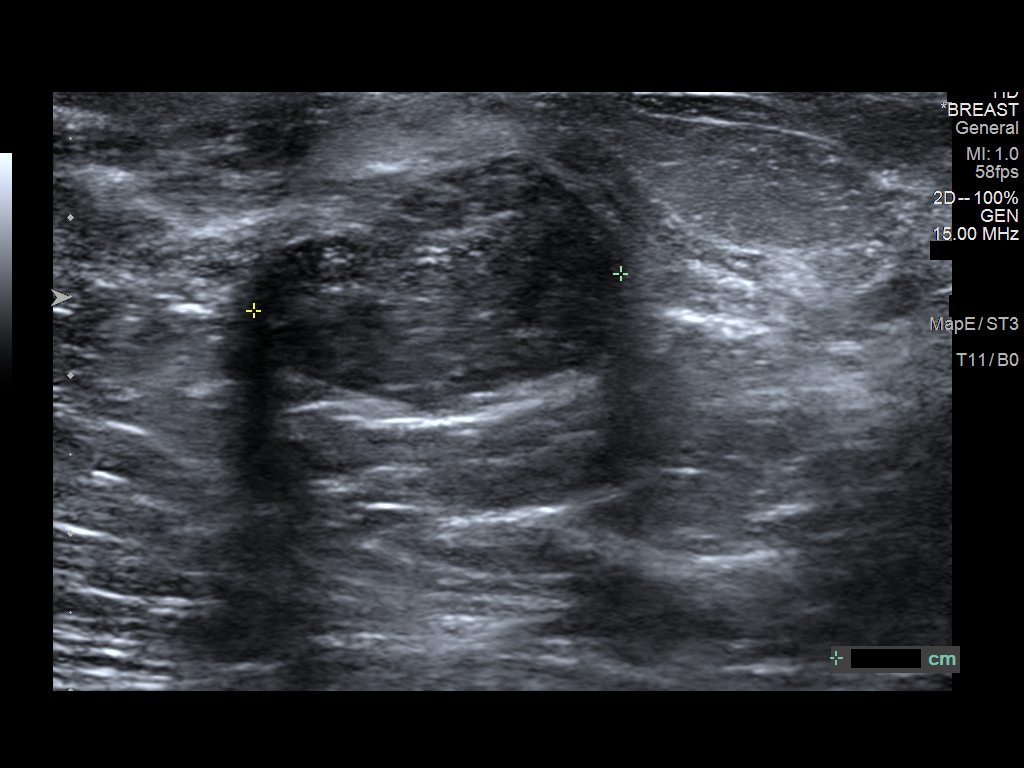
[im 3/4]
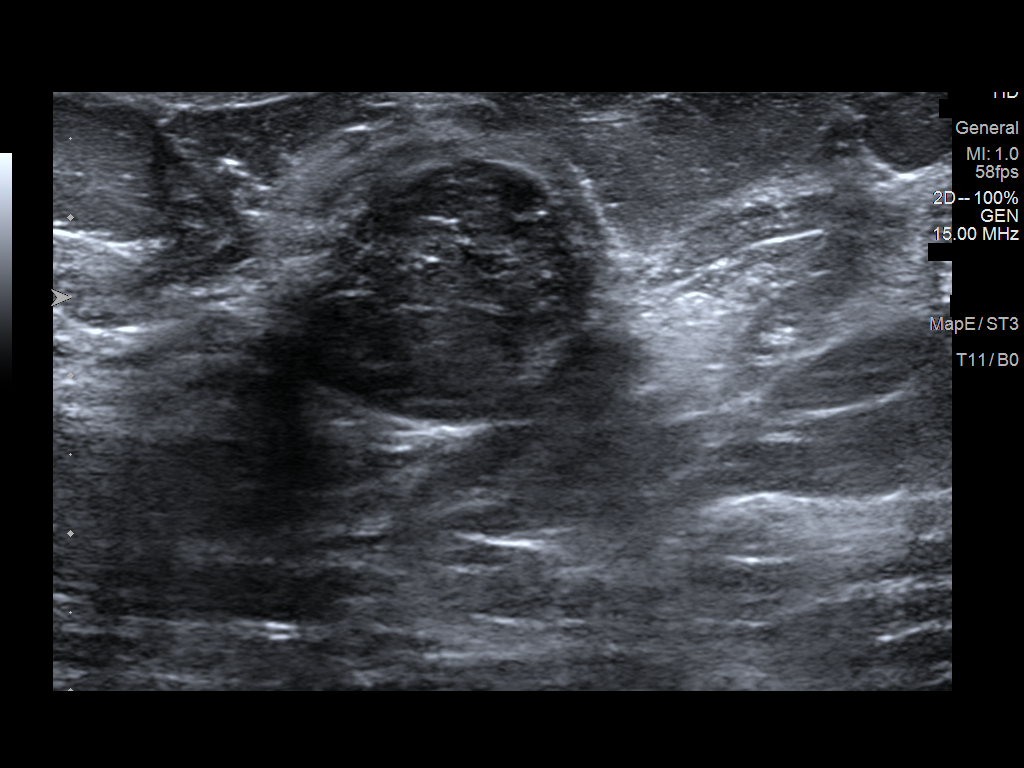
[im 4/4]
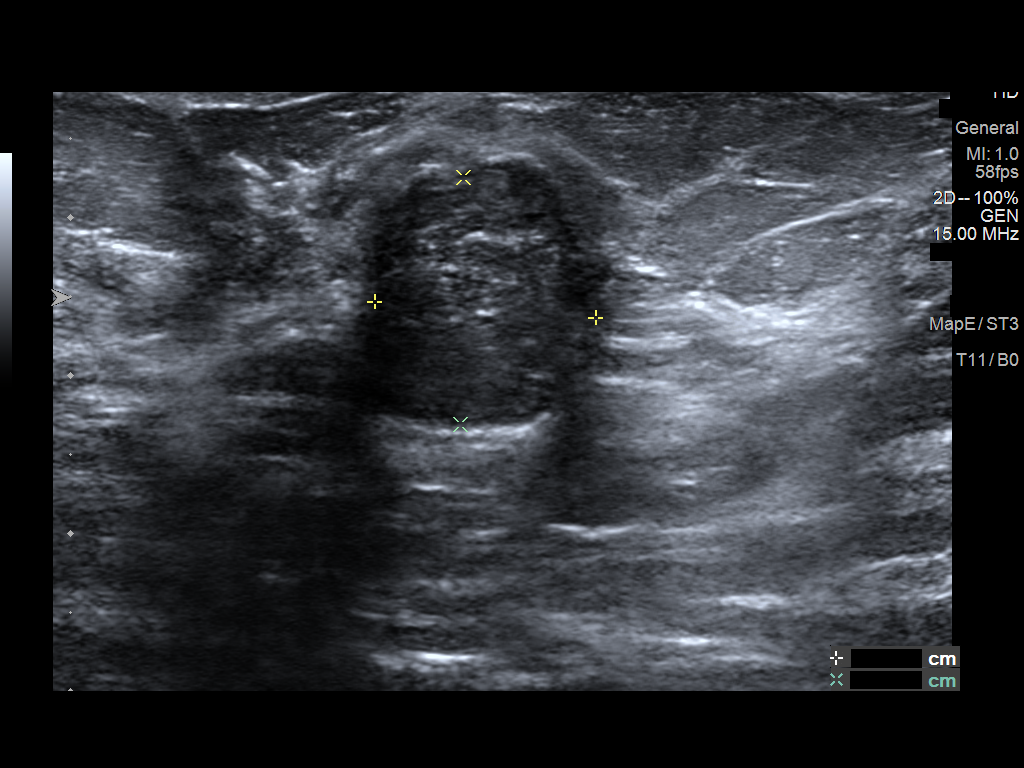

[4 of 4 positions shown; findings below may reference images not displayed]

FINDINGS: Targeted right breast ultrasound is performed, showing stable
benign-appearing hypoechoic circumscribed horizontally oriented mass
at 3 o'clock 1 cm from the nipple measuring 2.3 x 1.4 x 1.6 cm.

Targeted left breast ultrasound is performed showing 9:30 o'clock 5
cm from the nipple stable hypoechoic circumscribed mass measuring
3.0 x 2.7 x 2.1 cm. A second left breast 9:30 o'clock 5 cm from the
nipple mass measures 1.1 x 1.6 x 0.8 cm, also stable and benign in
sonographic appearance. Additionally, there is a left breast 12
o'clock 3 cm from the nipple enlarged in size macrolobulated mass
which measures 2.1 x 1.7 x 2.5 cm., increased from prior measurement
of 1.7 x 1.4 x 1.7 cm.
IMPRESSION: Left breast 12 o'clock 3 cm from the nipple increased in size mass,
for which ultrasound-guided core needle biopsy is recommended.

Stable probable fibroadenomas in the right breast 3 o'clock, and
left breast 9:30 o'clock, for which additional sonographic follow-up
in 1 year is recommended.

RECOMMENDATION:
Ultrasound-guided core needle biopsy of the left breast.

One year sonographic follow-up for additional bilateral stable
probably benign breast masses.

I have discussed the findings and recommendations with the patient.
Results were also provided in writing at the conclusion of the
visit. If applicable, a reminder letter will be sent to the patient
regarding the next appointment.

BI-RADS CATEGORY  3: Probably benign.

## 2021-01-10 DIAGNOSIS — F4010 Social phobia, unspecified: Secondary | ICD-10-CM | POA: Diagnosis not present

## 2021-01-10 DIAGNOSIS — F410 Panic disorder [episodic paroxysmal anxiety] without agoraphobia: Secondary | ICD-10-CM | POA: Diagnosis not present

## 2021-01-10 DIAGNOSIS — F902 Attention-deficit hyperactivity disorder, combined type: Secondary | ICD-10-CM | POA: Diagnosis not present

## 2021-01-10 DIAGNOSIS — F411 Generalized anxiety disorder: Secondary | ICD-10-CM | POA: Diagnosis not present

## 2021-02-09 ENCOUNTER — Ambulatory Visit: Payer: BC Managed Care – PPO | Admitting: Obstetrics and Gynecology

## 2021-02-10 DIAGNOSIS — R87612 Low grade squamous intraepithelial lesion on cytologic smear of cervix (LGSIL): Secondary | ICD-10-CM

## 2021-02-10 HISTORY — DX: Low grade squamous intraepithelial lesion on cytologic smear of cervix (LGSIL): R87.612

## 2021-02-22 ENCOUNTER — Encounter: Payer: Self-pay | Admitting: Obstetrics and Gynecology

## 2021-02-22 ENCOUNTER — Other Ambulatory Visit: Payer: Self-pay

## 2021-02-22 ENCOUNTER — Other Ambulatory Visit (HOSPITAL_COMMUNITY)
Admission: RE | Admit: 2021-02-22 | Discharge: 2021-02-22 | Disposition: A | Discharge status: Home / Self Care | Payer: BC Managed Care – PPO | Source: Ambulatory Visit | Attending: Obstetrics and Gynecology | Admitting: Obstetrics and Gynecology

## 2021-02-22 ENCOUNTER — Ambulatory Visit (INDEPENDENT_AMBULATORY_CARE_PROVIDER_SITE_OTHER): Payer: BC Managed Care – PPO | Admitting: Obstetrics and Gynecology

## 2021-02-22 VITALS — BP 130/78 | HR 92 | Ht 67.0 in | Wt 250.0 lb

## 2021-02-22 DIAGNOSIS — Z23 Encounter for immunization: Secondary | ICD-10-CM | POA: Diagnosis not present

## 2021-02-22 DIAGNOSIS — Z01419 Encounter for gynecological examination (general) (routine) without abnormal findings: Secondary | ICD-10-CM | POA: Insufficient documentation

## 2021-02-22 DIAGNOSIS — Z113 Encounter for screening for infections with a predominantly sexual mode of transmission: Secondary | ICD-10-CM | POA: Insufficient documentation

## 2021-02-22 NOTE — Progress Notes (Signed)
20 y.o. G0P0000 Single Caucasian female here for annual exam.    Rebecca Dunn is helping her periods.  Prior to [facility] her periods were heavy and painful.  Some current pain that feels like menstrual cramping when she is stressed or eats spicy foods.  She is concerned about her difficulty loosing weight.  Considering Paragard IUD.   Going to college in [place].  Received Covid series and no boosters. Does not do flu vaccines.  PCP:  Terrill Mohr, MD  No LMP recorded. (Menstrual status: IUD).     Period Cycle (Days):  (no cycles with Kyleena IUD)     Sexually active: No.  The current method of family planning isIUD--Kyleena 04/19/19.    Exercising: Yes.    Cardio 3x/week Smoker:  no  Health Maintenance: Pap:  n/a History of abnormal Pap:  n/a MMG: 01-28-19 Lt.Br.U/S guided core BX--pathology showed fibroadenoma Colonoscopy:  n/a BMD:   n/a  Result  n/a TDaP:  12-28-10 Gardasil:   yes HIV: 12-14-19 NR Hep C: 12-14-19 Neg Screening Labs:  PCP   reports that she has never smoked. She has never used smokeless tobacco. She reports current alcohol use of about 3.0 standard drinks per week. She reports that she does not use drugs.  Past Medical History:  Diagnosis Date   Abnormal uterine bleeding    Anxiety    Concussion 04/05/2013   volleyball   Elevated serum creatinine 2020   Hyperlipidemia    Low ferritin 2019   Migraines    Ovarian cyst     Past Surgical History:  Procedure Laterality Date   BREAST REDUCTION SURGERY  04/12/2020   BREAST SURGERY     removal of fibroadenomas   CYSTOSCOPY  11/16/2014   [facility]   LAPAROSCOPY N/A 08/16/2014   Procedure: LAPAROSCOPY DIAGNOSTIC, BIOPSY OF POSTERIOR CUL DE [place];  Surgeon: Donnamae Jude, MD;  Location: [facility];  Service: Gynecology;  Laterality: N/A;   PELVIC LAPAROSCOPY  08/12/2014   Dr. Shelbie Ammons for endometriosis    Current Outpatient Medications  Medication Sig Dispense Refill   amphetamine-dextroamphetamine  (ADDERALL XR) 15 MG 24 hr capsule Take 1 tablet by mouth every morning.     buPROPion (WELLBUTRIN XL) 300 MG 24 hr tablet Take 300 mg by mouth every morning.     escitalopram (LEXAPRO) 10 MG tablet Take 10 mg by mouth daily.     ibuprofen (ADVIL) 200 MG tablet Take 3 tablets by mouth 3 (three) times daily.     lamoTRIgine (LAMICTAL) 150 MG tablet Take 150 mg by mouth daily.     Levonorgestrel (KYLEENA) 19.5 MG IUD by Intrauterine route.     ondansetron (ZOFRAN) 4 MG tablet Take 1 tablet (4 mg total) by mouth every 8 (eight) hours as needed for nausea or vomiting. 20 tablet 0   rizatriptan (MAXALT-MLT) 10 MG disintegrating tablet Take 1 tablet (10 mg total) by mouth as needed for migraine. May repeat in 2 hours if needed 10 tablet 5   topiramate ER (QUDEXY XR) 150 MG CS24 sprinkle capsule Take 150 mg by mouth at bedtime.     No current facility-administered medications for this visit.    Family History  Problem Relation Age of Onset   Cancer Paternal Grandfather        dec 69 Leukemia   Breast cancer Paternal Grandmother 32   Seizures Mother        Epilepsy as child   Diabetes Maternal Grandfather    Thyroid disease Maternal Grandmother  hyperthyroid    Review of Systems  All other systems reviewed and are negative.  Exam:   BP 130/78   Pulse 92   Ht 5\' 7"  (1.702 m)   Wt 250 lb (113.4 kg)   SpO2 99%   BMI 39.16 kg/m     General appearance: alert, cooperative and appears stated age Head: normocephalic, without obvious abnormality, atraumatic Neck: no adenopathy, supple, symmetrical, trachea midline and thyroid normal to inspection and palpation Lungs: clear to auscultation bilaterally Breasts: bilateral reduction, no masses or tenderness, No nipple retraction or dimpling, No nipple discharge or bleeding, No axillary adenopathy Heart: regular rate and rhythm Abdomen: soft, non-tender; no masses, no organomegaly Extremities: extremities normal, atraumatic, no cyanosis or  edema Skin: skin color, texture, turgor normal. No rashes or lesions Lymph nodes: cervical, supraclavicular, and axillary nodes normal. Neurologic: grossly normal  Pelvic: External genitalia:  no lesions              No abnormal inguinal nodes palpated.              Urethra:  normal appearing urethra with no masses, tenderness or lesions              Bartholins and Skenes: normal                 Vagina: normal appearing vagina with normal color and discharge, no lesions              Cervix: no lesions.  IUD strings noted.              Pap taken:yes Bimanual Exam:  Uterus:  normal size, contour, position, consistency, mobility, non-tender              Adnexa: no mass, fullness, tenderness             Chaperone was present for exam:  Estill Bamberg, CMA  Assessment:   Well woman visit with gynecologic exam. Kyleena IUD, 04/19/2019.  Hx elevated cholesterol and triglycerides.  Left breast fibroadenomas. Status post bilateral breast reduction.   Plan: Mammogram screening discussed. Self breast awareness reviewed. Pap and HR HPV as above. Guidelines for Calcium, Vitamin D, regular exercise program including cardiovascular and weight bearing exercise. TDap. STD screening.  Condoms discussed. Routine labs with PCP.  We discussed Covid boosters.  IUD types discussed.  She will let me know if she wants to make a change to ParaGard or Mirena.   After visit summary provided.

## 2021-02-22 NOTE — Patient Instructions (Signed)

## 2021-02-23 LAB — RPR: RPR Ser Ql: NONREACTIVE

## 2021-02-23 LAB — HEPATITIS C ANTIBODY
Hepatitis C Ab: NONREACTIVE
SIGNAL TO CUT-OFF: 0.04 (ref <1.00)

## 2021-02-23 LAB — HEPATITIS B SURFACE ANTIGEN: Hepatitis B Surface Ag: NONREACTIVE

## 2021-02-23 LAB — HIV ANTIBODY (ROUTINE TESTING W REFLEX): HIV 1&2 Ab, 4th Generation: NONREACTIVE

## 2021-03-02 LAB — CYTOLOGY - PAP
Chlamydia: NEGATIVE
Comment: NEGATIVE
Comment: NEGATIVE
Comment: NORMAL
Neisseria Gonorrhea: NEGATIVE
Trichomonas: NEGATIVE

## 2021-03-04 ENCOUNTER — Encounter: Payer: Self-pay | Admitting: Obstetrics and Gynecology

## 2021-03-22 ENCOUNTER — Other Ambulatory Visit: Payer: Self-pay

## 2021-03-22 DIAGNOSIS — Z30430 Encounter for insertion of intrauterine contraceptive device: Secondary | ICD-10-CM

## 2021-03-22 DIAGNOSIS — Z30433 Encounter for removal and reinsertion of intrauterine contraceptive device: Secondary | ICD-10-CM

## 2021-04-03 ENCOUNTER — Encounter: Payer: Self-pay | Admitting: Obstetrics and Gynecology

## 2021-04-03 ENCOUNTER — Ambulatory Visit (INDEPENDENT_AMBULATORY_CARE_PROVIDER_SITE_OTHER): Payer: BC Managed Care – PPO | Admitting: Obstetrics and Gynecology

## 2021-04-03 ENCOUNTER — Other Ambulatory Visit: Payer: Self-pay

## 2021-04-03 VITALS — BP 106/72 | HR 80 | Resp 14 | Ht 68.0 in | Wt 255.0 lb

## 2021-04-03 DIAGNOSIS — Z01812 Encounter for preprocedural laboratory examination: Secondary | ICD-10-CM | POA: Diagnosis not present

## 2021-04-03 DIAGNOSIS — Z30433 Encounter for removal and reinsertion of intrauterine contraceptive device: Secondary | ICD-10-CM

## 2021-04-03 DIAGNOSIS — R87612 Low grade squamous intraepithelial lesion on cytologic smear of cervix (LGSIL): Secondary | ICD-10-CM

## 2021-04-03 LAB — PREGNANCY, URINE: Preg Test, Ur: NEGATIVE

## 2021-04-03 NOTE — Progress Notes (Signed)
GYNECOLOGY  VISIT   HPI: 20 y.o.   Single  Caucasian  female   G0P0000 with No LMP recorded. (Menstrual status: IUD).   here for   kyleena IUD removal and Paraguard IUD insertion   Not having periods with her Verdia Kuba IUD.   Neg GC/CT on 02/22/21. No new partner.   Took Advil prior to visit today.   UPT negative.   GYNECOLOGIC HISTORY: No LMP recorded. (Menstrual status: IUD). Contraception:  IUD Menopausal hormone therapy:  none Last mammogram:  none Last pap smear:   02-22-21 LSIL        OB History     Gravida  0   Para  0   Term  0   Preterm  0   AB  0   Living  0      SAB  0   IAB  0   Ectopic  0   Multiple  0   Live Births                 Patient Active Problem List   Diagnosis Date Noted   Episodic tension-type headache, not intractable 03/05/2017   Daytime somnolence 12/04/2016   Acanthosis nigricans 08/13/2016   Obesity 08/13/2016   Abdominal pain 08/03/2014   Dysmenorrhea 05/31/2014   Obesity (BMI 30-39.9) 12/28/2013   Migraine without aura 11/30/2013   CHI (closed head injury) 02/09/2013    Past Medical History:  Diagnosis Date   Abnormal uterine bleeding    Anxiety    Concussion 04/05/2013   volleyball   Elevated serum creatinine 2020   Hyperlipidemia    LGSIL on Pap smear of cervix 02/2021   Low ferritin 2019   Migraines    Ovarian cyst     Past Surgical History:  Procedure Laterality Date   BREAST REDUCTION SURGERY  04/12/2020   BREAST SURGERY     removal of fibroadenomas   CYSTOSCOPY  11/16/2014   [facility]   LAPAROSCOPY N/A 08/16/2014   Procedure: LAPAROSCOPY DIAGNOSTIC, BIOPSY OF POSTERIOR CUL DE [place];  Surgeon: Donnamae Jude, MD;  Location: [facility];  Service: Gynecology;  Laterality: N/A;   PELVIC LAPAROSCOPY  08/12/2014   Dr. Shelbie Ammons for endometriosis    Current Outpatient Medications  Medication Sig Dispense Refill   amphetamine-dextroamphetamine (ADDERALL XR) 15 MG 24 hr capsule Take 1 tablet by mouth every  morning.     buPROPion (WELLBUTRIN XL) 300 MG 24 hr tablet Take 300 mg by mouth every morning.     escitalopram (LEXAPRO) 10 MG tablet Take 10 mg by mouth daily.     ibuprofen (ADVIL) 200 MG tablet Take 3 tablets by mouth 3 (three) times daily.     lamoTRIgine (LAMICTAL) 150 MG tablet Take 150 mg by mouth daily.     Levonorgestrel (KYLEENA) 19.5 MG IUD by Intrauterine route.     ondansetron (ZOFRAN) 4 MG tablet Take 1 tablet (4 mg total) by mouth every 8 (eight) hours as needed for nausea or vomiting. 20 tablet 0   rizatriptan (MAXALT-MLT) 10 MG disintegrating tablet Take 1 tablet (10 mg total) by mouth as needed for migraine. May repeat in 2 hours if needed 10 tablet 5   topiramate ER (QUDEXY XR) 150 MG CS24 sprinkle capsule Take 150 mg by mouth at bedtime.     No current facility-administered medications for this visit.     ALLERGIES: Patient has no known allergies.  Family History  Problem Relation Age of Onset   Cancer Paternal Grandfather  dec 69 Leukemia   Breast cancer Paternal Grandmother 33   Seizures Mother        Epilepsy as child   Diabetes Maternal Grandfather    Thyroid disease Maternal Grandmother        hyperthyroid    Social History   Socioeconomic History   Marital status: Single    Spouse name: Not on file   Number of children: Not on file   Years of education: Not on file   Highest education level: Not on file  Occupational History   Not on file  Tobacco Use   Smoking status: Never   Smokeless tobacco: Never  Vaping Use   Vaping Use: Never used  Substance and Sexual Activity   Alcohol use: Yes    Alcohol/week: 3.0 standard drinks    Types: 3 Standard drinks or equivalent per week   Drug use: No   Sexual activity: Not Currently    Birth control/protection: Pill    Comment: Kyleena IUD 04-19-19  Other Topics Concern   Not on file  [place]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   She attended [employer].     She lives on campus during school.    She enjoys basketball, soccer, mission trips, and doing make-up.   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   Social Determinants of Health   Financial Resource Strain: Not on file  Food Insecurity: Not on file  Transportation Needs: Not on file  Physical Activity: Not on file  Stress: Not on file  Social Connections: Not on file  Intimate Partner Violence: Not on file    Review of Systems  All other systems reviewed and are negative.  PHYSICAL EXAMINATION:    BP 106/72 (BP Location: Right Arm, Patient Position: Sitting, Cuff Size: Large)   Pulse 80   Resp 14   Ht 5\' 8"  (1.727 m)   Wt 255 lb (115.7 kg)   BMI 38.77 kg/m     General appearance: alert, cooperative and appears stated age   Pelvic: External genitalia:  no lesions              Urethra:  normal appearing urethra with no masses, tenderness or lesions              Bartholins and Skenes: normal                 Vagina: normal appearing vagina with normal color and discharge, no lesions              Cervix: no lesions.  IUD string noted.                 Bimanual Exam:  Uterus:  normal size, contour, position, consistency, mobility, non-tender              Adnexa: no mass, fullness, tenderness               Chaperone was present for exam:  Raquel Sarna, RN.  Procedure Removal of Kyleena IUD and placement of ParaGard IUD.  Consent for procedures.  Sterile prep with Hibiclens.  Paracervical block with 10 cc 1% lidocaine.  Lot [id number], exp 08/2022.  Kyleena IUD removed, intact, and discarded.  Tenaculum to anterior cervical lip. Uterus sounded to 7 cm.  ParaGard IUD placed without difficulty.  Lot [id number], exp 11/2026.  Strings trimmed and shown to patient.  Repeat Bimanual exam - no change.   No complications.  Minimal EBL.   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  Removal of [place] IUD and placement of ParaGard IUD.  PLAN  We discussed her pap result, which the patient  was unaware of.  Recommendation for next pap in one year.  Emergency contraception nature of the ParaGard IUD discussed.  IUD card to patient.  FU in 4 weeks.    An After Visit Summary was printed and given to the patient.

## 2021-04-03 NOTE — Patient Instructions (Signed)
Intrauterine Device Insertion, Care After This sheet gives you information about how to care for yourself after your procedure. Your health care provider may also give you more specific instructions. If you have problems or questions, contact your health care provider. What can I expect after the procedure? After the procedure, it is common to have: Cramps and pain in the abdomen. Bleeding. It may be light or heavy. This may last for a few days. Lower back pain. Dizziness. Headaches. Nausea. Follow these instructions at home:  Before resuming sexual activity, check to make sure that you can feel the IUD string or strings. You should be able to feel the end of the string below the opening of your cervix. If your IUD string is in place, you may resume sexual activity. If you had a hormonal IUD inserted more than 7 days after your most recent period started, you will need to use a backup method of birth control for 7 days after IUD insertion. Ask your health care provider whether this applies to you. Continue to check that the IUD is still in place by feeling for the strings after every menstrual period, or once a month. An IUD will not protect you from sexually transmitted infections (STIs). Use methods to prevent the exchange of body fluids between partners (barrier protection) every time you have sex. Barrier protection can be used during oral, vaginal, or anal sex. Commonly used barrier methods include: Female condom. Female condom. Dental dam. Take over-the-counter and prescription medicines only as told by your health care provider. Keep all follow-up visits as told by your health care provider. This is important. Contact a health care provider if: You feel light-headed or weak. You have any of the following problems with your IUD string or strings: The string bothers or hurts you or your sexual partner. You cannot feel the string. The string has gotten longer. You can feel the IUD in  your vagina. You think you may be pregnant, or you miss your menstrual period. You think you may have a sexually transmitted infection (STI). Get help right away if: You have flu-like symptoms, such as tiredness (fatigue) and muscle aches. You have a fever and chills. You have bleeding that is heavier or lasts longer than a normal menstrual cycle. You have abnormal or bad-smelling discharge from your vagina. You develop abdominal pain that is new, is getting worse, or is not in the same area of earlier cramping and pain. You have pain during sexual activity. Summary After the procedure, it is common to have cramps and pain in the abdomen. It is also common to have light bleeding or heavier bleeding that is like your menstrual period. Continue to check that the IUD is still in place by feeling for the strings after every menstrual period, or once a month. Keep all follow-up visits as told by your health care provider. This is important. Contact your health care provider if you have problems with your IUD strings, such as the string getting longer or bothering you or your sexual partner. This information is not intended to replace advice given to you by your health care provider. Make sure you discuss any questions you have with your health care provider. Document Revised: 04/20/2019 Document Reviewed: 04/20/2019 Elsevier Patient Education  2022 Elsevier Inc.  

## 2021-04-04 DIAGNOSIS — F4010 Social phobia, unspecified: Secondary | ICD-10-CM | POA: Diagnosis not present

## 2021-04-04 DIAGNOSIS — F410 Panic disorder [episodic paroxysmal anxiety] without agoraphobia: Secondary | ICD-10-CM | POA: Diagnosis not present

## 2021-04-04 DIAGNOSIS — F411 Generalized anxiety disorder: Secondary | ICD-10-CM | POA: Diagnosis not present

## 2021-04-04 DIAGNOSIS — F339 Major depressive disorder, recurrent, unspecified: Secondary | ICD-10-CM | POA: Diagnosis not present

## 2021-04-04 NOTE — Progress Notes (Signed)
Patient read my chart message. Last read by Genelle Lindsey Martinique at 12:47 PM on 04/03/2021.

## 2021-04-09 ENCOUNTER — Encounter: Payer: Self-pay | Admitting: Obstetrics and Gynecology

## 2021-06-18 DIAGNOSIS — R0683 Snoring: Secondary | ICD-10-CM | POA: Diagnosis not present

## 2021-06-18 DIAGNOSIS — G4719 Other hypersomnia: Secondary | ICD-10-CM | POA: Diagnosis not present

## 2021-08-08 DIAGNOSIS — F411 Generalized anxiety disorder: Secondary | ICD-10-CM | POA: Diagnosis not present

## 2021-08-08 DIAGNOSIS — F902 Attention-deficit hyperactivity disorder, combined type: Secondary | ICD-10-CM | POA: Diagnosis not present

## 2021-08-08 DIAGNOSIS — F339 Major depressive disorder, recurrent, unspecified: Secondary | ICD-10-CM | POA: Diagnosis not present

## 2021-08-08 DIAGNOSIS — F4010 Social phobia, unspecified: Secondary | ICD-10-CM | POA: Diagnosis not present

## 2021-09-05 DIAGNOSIS — R0683 Snoring: Secondary | ICD-10-CM | POA: Diagnosis not present

## 2021-09-05 DIAGNOSIS — Z820 Family history of epilepsy and other diseases of the nervous system: Secondary | ICD-10-CM | POA: Diagnosis not present

## 2021-09-05 DIAGNOSIS — F458 Other somatoform disorders: Secondary | ICD-10-CM | POA: Diagnosis not present

## 2021-09-05 DIAGNOSIS — G4719 Other hypersomnia: Secondary | ICD-10-CM | POA: Diagnosis not present

## 2021-09-06 DIAGNOSIS — G4710 Hypersomnia, unspecified: Secondary | ICD-10-CM | POA: Diagnosis not present

## 2021-12-11 DIAGNOSIS — F902 Attention-deficit hyperactivity disorder, combined type: Secondary | ICD-10-CM | POA: Diagnosis not present

## 2021-12-11 DIAGNOSIS — F411 Generalized anxiety disorder: Secondary | ICD-10-CM | POA: Diagnosis not present

## 2021-12-11 DIAGNOSIS — F339 Major depressive disorder, recurrent, unspecified: Secondary | ICD-10-CM | POA: Diagnosis not present

## 2021-12-11 DIAGNOSIS — F4010 Social phobia, unspecified: Secondary | ICD-10-CM | POA: Diagnosis not present

## 2021-12-24 NOTE — Progress Notes (Unsigned)
GYNECOLOGY  VISIT   HPI: 21 y.o.   Single  Caucasian  female   G0P0000 with Patient's last menstrual period was 12/24/2021 (exact date).   here for IUD check. Patient states she can feel string low in vagina.  Had [place] IUD removed and had Paragard IUD placed on 04/03/21. She is not experiencing pelvic pain, just wants to check on the IUD string length.   She also is concerned about possible PCOS.  Menstruation is fairly regular: Occur every 3 - 5 weeks.  Last 4 days.  No heavy, but has bad cramping.  Experiencing hair growth on chin and neck and has some acne on her back.  Leaving for [facility] [facility] August 31, and will be gone for a semester.   GYNECOLOGIC HISTORY: Patient's last menstrual period was 12/24/2021 (exact date). Contraception:  Paragard IUD 04-03-21 Menopausal hormone therapy:  n/a Last mammogram:  n/a Last pap smear:     02-22-21 LSIL        OB History     Gravida  0   Para  0   Term  0   Preterm  0   AB  0   Living  0      SAB  0   IAB  0   Ectopic  0   Multiple  0   Live Births                 Patient Active Problem List   Diagnosis Date Noted   Episodic tension-type headache, not intractable 03/05/2017   Daytime somnolence 12/04/2016   Acanthosis nigricans 08/13/2016   Obesity 08/13/2016   Abdominal pain 08/03/2014   Dysmenorrhea 05/31/2014   Obesity (BMI 30-39.9) 12/28/2013   Migraine without aura 11/30/2013   CHI (closed head injury) 02/09/2013    Past Medical History:  Diagnosis Date   Abnormal uterine bleeding    Anxiety    Concussion 04/05/2013   volleyball   Elevated serum creatinine 2020   Hyperlipidemia    LGSIL on Pap smear of cervix 02/2021   Low ferritin 2019   Migraines    Ovarian cyst     Past Surgical History:  Procedure Laterality Date   BREAST REDUCTION SURGERY  04/12/2020   BREAST SURGERY     removal of fibroadenomas   CYSTOSCOPY  11/16/2014   [facility]   LAPAROSCOPY N/A 08/16/2014   Procedure:  LAPAROSCOPY DIAGNOSTIC, BIOPSY OF POSTERIOR CUL DE [place];  Surgeon: Donnamae Jude, MD;  Location: [facility];  Service: Gynecology;  Laterality: N/A;   PELVIC LAPAROSCOPY  08/12/2014   Dr. Shelbie Ammons for endometriosis    Current Outpatient Medications  Medication Sig Dispense Refill   amphetamine-dextroamphetamine (ADDERALL XR) 15 MG 24 hr capsule Take 1 tablet by mouth every morning.     buPROPion (WELLBUTRIN XL) 300 MG 24 hr tablet Take 300 mg by mouth every morning.     escitalopram (LEXAPRO) 10 MG tablet Take 10 mg by mouth daily.     ibuprofen (ADVIL) 200 MG tablet Take 3 tablets by mouth 3 (three) times daily.     lamoTRIgine (LAMICTAL) 150 MG tablet Take 150 mg by mouth daily.     ondansetron (ZOFRAN) 4 MG tablet Take 1 tablet (4 mg total) by mouth every 8 (eight) hours as needed for nausea or vomiting. 20 tablet 0   rizatriptan (MAXALT-MLT) 10 MG disintegrating tablet Take 1 tablet (10 mg total) by mouth as needed for migraine. May repeat in 2 hours if needed 10 tablet  5   topiramate ER (QUDEXY XR) 150 MG CS24 sprinkle capsule Take 150 mg by mouth at bedtime.     zolpidem (AMBIEN) 5 MG tablet Take 5 mg by mouth at bedtime as needed.     No current facility-administered medications for this visit.     ALLERGIES: Patient has no known allergies.  Family History  Problem Relation Age of Onset   Cancer Paternal Grandfather        dec 69 Leukemia   Breast cancer Paternal Grandmother 56   Seizures Mother        Epilepsy as child   Diabetes Maternal Grandfather    Thyroid disease Maternal Grandmother        hyperthyroid    Social History   Socioeconomic History   Marital status: Single    Spouse name: Not on file   Number of children: Not on file   Years of education: Not on file   Highest education level: Not on file  Occupational History   Not on file  Tobacco Use   Smoking status: Never   Smokeless tobacco: Never  Vaping Use   Vaping Use: Never used  Substance and Sexual  Activity   Alcohol use: Yes    Alcohol/week: 3.0 standard drinks of alcohol    Types: 3 Standard drinks or equivalent per week   Drug use: No   Sexual activity: Not Currently    Birth control/protection: Pill    Comment: Kyleena IUD 04-19-19  Other Topics Concern   Not on file  [place]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   She attended [employer].    She lives on campus during school.    She enjoys basketball, soccer, mission trips, and doing make-up.   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   Social Determinants of Health   Financial Resource Strain: Not on file  Food Insecurity: Not on file  Transportation Needs: Not on file  Physical Activity: Not on file  Stress: Not on file  Social Connections: Not on file  Intimate Partner Violence: Not on file    Review of Systems  Genitourinary:        IUD string in vagina  All other systems reviewed and are negative.   PHYSICAL EXAMINATION:    BP 122/80   Ht '5\' 8"'$  (1.727 m)   Wt 255 lb (115.7 kg)   LMP 12/24/2021 (Exact Date)   BMI 38.77 kg/m     General appearance: alert, cooperative and appears stated age   Pelvic: External genitalia:  no lesions              Urethra:  normal appearing urethra with no masses, tenderness or lesions              Bartholins and Skenes: normal                 Vagina: normal appearing vagina with normal color and discharge, no lesions              Cervix: no lesions.  Menstrual flow noted.  IUD strings trimmed to 1 inch.                  Bimanual Exam:  Uterus:  normal size, contour, position, consistency, mobility, non-tender              Adnexa: no mass, fullness, tenderness   Chaperone was present for exam: Estill Bamberg, CMA  ASSESSMENT  IUD check up.  Strings trimmed.  Hirsutism.  Possible PCOS versus other etiology.  Hx LGSIL pap.  PLAN  Reassurance regarding IUD.  She has no pain, so ultrasound is not needed.  We discussed blood  work to evaluate for PCOS and hirsutism.   She will do this when she returns from her semester abroad.  Annual exam with pap and blood work in January, 2024.    An After Visit Summary was printed and given to the patient.  24 min  total time was spent for this patient encounter, including preparation, face-to-face counseling with the patient, coordination of care, and documentation of the encounter.

## 2021-12-26 ENCOUNTER — Encounter: Payer: Self-pay | Admitting: Obstetrics and Gynecology

## 2021-12-26 ENCOUNTER — Ambulatory Visit (INDEPENDENT_AMBULATORY_CARE_PROVIDER_SITE_OTHER): Payer: BC Managed Care – PPO | Admitting: Obstetrics and Gynecology

## 2021-12-26 VITALS — BP 122/80 | Ht 68.0 in | Wt 255.0 lb

## 2021-12-26 DIAGNOSIS — L680 Hirsutism: Secondary | ICD-10-CM | POA: Diagnosis not present

## 2021-12-26 DIAGNOSIS — Z30431 Encounter for routine checking of intrauterine contraceptive device: Secondary | ICD-10-CM

## 2021-12-26 NOTE — Patient Instructions (Signed)
Polycystic Ovary Syndrome ? ?Polycystic ovarian syndrome (PCOS) is a common hormonal disorder among women of reproductive age. In most women with PCOS, small fluid-filled sacs (cysts) grow on the ovaries. PCOS can cause problems with menstrual periods and make it hard to get and stay pregnant. If this condition is not treated, it can lead to serious health problems, such as diabetes and heart disease. ?What are the causes? ?The cause of this condition is not known. It may be due to certain factors, such as: ?Irregular menstrual cycle. ?High levels of certain hormones. ?Problems with the hormone that helps to control blood sugar (insulin). ?Certain genes. ?What increases the risk? ?You are more likely to develop this condition if you: ?Have a family history of PCOS or type 2 diabetes. ?Are overweight, eat unhealthy foods, and are not active. These factors may cause problems with blood sugar control, which can contribute to PCOS or PCOS symptoms. ?What are the signs or symptoms? ?Symptoms of this condition include: ?Ovarian cysts and sometimes pelvic pain. ?Menstrual periods that are not regular or are too heavy. ?Inability to get or stay pregnant. ?Increased growth of hair on the face, chest, stomach, back, thumbs, thighs, or toes. ?Acne or oily skin. Acne may develop during adulthood, and it may not get better with treatment. ?Weight gain or obesity. ?Patches of thickened and dark brown or black skin on the neck, arms, breasts, or thighs. ?How is this diagnosed? ?This condition is diagnosed based on: ?Your medical history. ?A physical exam that includes a pelvic exam. Your health care provider may look for areas of increased hair growth on your skin. ?Tests, such as: ?An ultrasound to check the ovaries for cysts and to view the lining of the uterus. ?Blood tests to check levels of sugar (glucose), female hormone (testosterone), and female hormones (estrogen and progesterone). ?How is this treated? ?There is no cure  for this condition, but treatment can help to manage symptoms and prevent more health problems from developing. Treatment varies depending on your symptoms and if you want to have a baby or if you need birth control. ?Treatment may include: ?Making nutrition and lifestyle changes. ?Taking the progesterone hormone to start a menstrual period. ?Taking birth control pills to help you have regular menstrual periods. ?Taking medicines such as: ?Medicines to make you ovulate, if you want to get pregnant. ?Medicine to reduce extra hair growth. ?Having surgery in severe cases. This may involve making small holes in one or both of your ovaries. This decreases the amount of testosterone that your body makes. ?Follow these instructions at home: ?Take over-the-counter and prescription medicines only as told by your health care provider. ?Follow a healthy meal plan that includes lean proteins, complex carbohydrates, fresh fruits and vegetables, low-fat dairy products, healthy fats, and fiber. ?If you are overweight, lose weight as told by your health care provider. Your health care provider can determine how much weight loss is best for you and can help you lose weight safely. ?Keep all follow-up visits. This is important. ?Contact a health care provider if: ?Your symptoms do not get better with medicine. ?Your symptoms get worse or you develop new symptoms. ?Summary ?Polycystic ovarian syndrome (PCOS) is a common hormonal disorder among women of reproductive age. ?PCOS can cause problems with menstrual periods and make it hard to get and stay pregnant. ?If this condition is not treated, it can lead to serious health problems, such as diabetes and heart disease. ?There is no cure for this condition, but treatment   can help to manage symptoms and prevent more health problems from developing. ?This information is not intended to replace advice given to you by your health care provider. Make sure you discuss any questions you have  with your health care provider. ?Document Revised: 10/07/2019 Document Reviewed: 10/07/2019 ?Elsevier Patient Education ? 2023 Elsevier Inc. ? ?

## 2022-05-03 NOTE — Progress Notes (Signed)
21 y.o. G0P0000 Single Caucasian female here for annual exam.    Periods are different.  Starts and stops.  Total of 4 - 5 days of bleeding.  Cramps, headaches and nausea are the same.  Having pain with sex.  This is ongoing  Somewhat position and related to deep penetration.  Some coital bleeding.  Lubricant does not make a difference.   Pain is less with her cycle than in the past.   Asking about pelvic floor therapy.  Asking about PCOS. Has hirsutism and shaves her face. Wants lab evaluation.   Agrees to STD testing today.   Studied abroad in [place].  Moving to [place] in hear future.  PCP:   Charlena Cross, MD  Patient's last menstrual period was 05/11/2022.     Period Cycle (Days): 28 Period Duration (Days): 2-5 Period Pattern: Regular Menstrual Flow: Moderate Menstrual Control: Tampon, Maxi pad Dysmenorrhea: (!) Moderate     Sexually active: Yes.    The current method of family planning is IUD--Paraguard 04/03/21.    Exercising: Yes.     Light cardio. Smoker:  no  Health Maintenance: Pap:   02-22-21 LSIL History of abnormal Pap:  yes MMG:  n/a Colonoscopy:  n/a BMD:   n/a  Result  n/a TDaP:  02/22/21 Gardasil:   no HIV: donated blood Hep C: donated blood Screening Labs:  today.   reports that she has never smoked. She has never used smokeless tobacco. She reports current alcohol use of about 3.0 standard drinks of alcohol per week. She reports that she does not use drugs.  Past Medical History:  Diagnosis Date   Abnormal uterine bleeding    Anxiety    Concussion 04/05/2013   volleyball   Elevated serum creatinine 2020   Hyperlipidemia    LGSIL on Pap smear of cervix 02/2021   Low ferritin 2019   Migraines    Ovarian cyst     Past Surgical History:  Procedure Laterality Date   BREAST REDUCTION SURGERY  04/12/2020   BREAST SURGERY     removal of fibroadenomas   CYSTOSCOPY  11/16/2014   [facility]   LAPAROSCOPY N/A 08/16/2014   Procedure:  LAPAROSCOPY DIAGNOSTIC, BIOPSY OF POSTERIOR CUL DE [place];  Surgeon: Donnamae Jude, MD;  Location: [facility];  Service: Gynecology;  Laterality: N/A;   PELVIC LAPAROSCOPY  08/12/2014   Dr. Shelbie Ammons for endometriosis    Current Outpatient Medications  Medication Sig Dispense Refill   amphetamine-dextroamphetamine (ADDERALL XR) 15 MG 24 hr capsule Take 1 tablet by mouth every morning.     buPROPion (WELLBUTRIN XL) 300 MG 24 hr tablet Take 300 mg by mouth every morning.     escitalopram (LEXAPRO) 10 MG tablet Take 10 mg by mouth daily.     ibuprofen (ADVIL) 200 MG tablet Take 3 tablets by mouth 3 (three) times daily.     lamoTRIgine (LAMICTAL) 150 MG tablet Take 150 mg by mouth daily.     rizatriptan (MAXALT-MLT) 10 MG disintegrating tablet Take 1 tablet (10 mg total) by mouth as needed for migraine. May repeat in 2 hours if needed 10 tablet 5   topiramate ER (QUDEXY XR) 150 MG CS24 sprinkle capsule Take 150 mg by mouth at bedtime.     zolpidem (AMBIEN) 5 MG tablet Take 5 mg by mouth at bedtime as needed. (Patient not taking: Reported on 05/15/2022)     No current facility-administered medications for this visit.    Family History  Problem Relation Age of  Onset   Cancer Paternal Grandfather        dec 69 Leukemia   Breast cancer Paternal Grandmother 67   Seizures Mother        Epilepsy as child   Diabetes Maternal Grandfather    Thyroid disease Maternal Grandmother        hyperthyroid    Review of Systems  All other systems reviewed and are negative.   Exam:   BP 124/78 (BP Location: Left Arm, Patient Position: Sitting, Cuff Size: Large)   Pulse 84   Ht '5\' 8"'$  (1.727 m)   Wt 254 lb (115.2 kg)   LMP 05/11/2022   SpO2 98%   BMI 38.62 kg/m     General appearance: alert, cooperative and appears stated age Head: normocephalic, without obvious abnormality, atraumatic Neck: no adenopathy, supple, symmetrical, trachea midline and thyroid normal to inspection and palpation Lungs: clear to  auscultation bilaterally Breasts: consistent with reduction, no masses or tenderness, No nipple retraction or dimpling, No nipple discharge or bleeding, No axillary adenopathy Heart: regular rate and rhythm Abdomen: soft, non-tender; no masses, no organomegaly Extremities: extremities normal, atraumatic, no cyanosis or edema Skin: skin color, texture, turgor normal. No rashes or lesions Lymph nodes: cervical, supraclavicular, and axillary nodes normal. Neurologic: grossly normal  Pelvic: External genitalia:  no lesions              No abnormal inguinal nodes palpated.              Urethra:  normal appearing urethra with no masses, tenderness or lesions              Bartholins and Skenes: normal                 Vagina: normal appearing vagina with normal color and discharge, no lesions              Cervix: no lesions.  IUD strings noted.               Pap taken: yes Bimanual Exam:  Uterus:  normal size, contour, position, consistency, mobility, non-tender              Adnexa: no mass, fullness, tenderness  Chaperone was present for exam:  Raquel Sarna  Assessment:   Well woman visit with gynecologic exam. Paragard IUD.  Hx LGSIL pap. Hx elevated cholesterol and triglycerides.  Hx left breast fibroadenomas. Status post bilateral breast reduction.  Dyspareunia.   Hirsutism.   Plan: Mammogram screening discussed. Self breast awareness reviewed. Pap collected.  STD screening.  Guidelines for Calcium, Vitamin D, regular exercise program including cardiovascular and weight bearing exercise. Referral for pelvic floor therapy.   I discussed sexual function counseling as an option as well Check FSH, LH, DHEAS, 17 OH-P, cortisol, testosterone prolactin, TSH.  Check cholesterol panel, CMP, A1C, CBC.  Return for pelvic US.  Follow up annually and prn.   After visit summary provided.   25 min  total time was spent for this patient encounter, including preparation, face-to-face counseling  with the patient, coordination of care, and documentation of the encounter in addition to performing annual exam.

## 2022-05-15 ENCOUNTER — Encounter: Payer: Self-pay | Admitting: Obstetrics and Gynecology

## 2022-05-15 ENCOUNTER — Ambulatory Visit (INDEPENDENT_AMBULATORY_CARE_PROVIDER_SITE_OTHER): Payer: BC Managed Care – PPO | Admitting: Obstetrics and Gynecology

## 2022-05-15 ENCOUNTER — Other Ambulatory Visit (HOSPITAL_COMMUNITY)
Admission: RE | Admit: 2022-05-15 | Discharge: 2022-05-15 | Disposition: A | Discharge status: Home / Self Care | Payer: BC Managed Care – PPO | Source: Ambulatory Visit | Attending: Obstetrics and Gynecology | Admitting: Obstetrics and Gynecology

## 2022-05-15 ENCOUNTER — Telehealth: Payer: Self-pay | Admitting: Obstetrics and Gynecology

## 2022-05-15 VITALS — BP 124/78 | HR 84 | Ht 68.0 in | Wt 254.0 lb

## 2022-05-15 DIAGNOSIS — E785 Hyperlipidemia, unspecified: Secondary | ICD-10-CM | POA: Diagnosis not present

## 2022-05-15 DIAGNOSIS — Z01419 Encounter for gynecological examination (general) (routine) without abnormal findings: Secondary | ICD-10-CM

## 2022-05-15 DIAGNOSIS — R87612 Low grade squamous intraepithelial lesion on cytologic smear of cervix (LGSIL): Secondary | ICD-10-CM

## 2022-05-15 DIAGNOSIS — Z124 Encounter for screening for malignant neoplasm of cervix: Secondary | ICD-10-CM

## 2022-05-15 DIAGNOSIS — Z113 Encounter for screening for infections with a predominantly sexual mode of transmission: Secondary | ICD-10-CM

## 2022-05-15 DIAGNOSIS — N941 Unspecified dyspareunia: Secondary | ICD-10-CM

## 2022-05-15 DIAGNOSIS — Z0000 Encounter for general adult medical examination without abnormal findings: Secondary | ICD-10-CM

## 2022-05-15 DIAGNOSIS — Z114 Encounter for screening for human immunodeficiency virus [HIV]: Secondary | ICD-10-CM | POA: Diagnosis not present

## 2022-05-15 DIAGNOSIS — L680 Hirsutism: Secondary | ICD-10-CM

## 2022-05-15 DIAGNOSIS — Z1159 Encounter for screening for other viral diseases: Secondary | ICD-10-CM

## 2022-05-15 DIAGNOSIS — R7989 Other specified abnormal findings of blood chemistry: Secondary | ICD-10-CM | POA: Diagnosis not present

## 2022-05-15 NOTE — Telephone Encounter (Signed)
Referral sent 

## 2022-05-15 NOTE — Patient Instructions (Signed)

## 2022-05-15 NOTE — Telephone Encounter (Signed)
Please make a referral for pelvic floor therapy at [facility] for my patient who has dyspareunia.

## 2022-05-16 LAB — LIPID PANEL
Cholesterol: 217 mg/dL — ABNORMAL HIGH (ref <200)
HDL: 49 mg/dL — ABNORMAL LOW (ref >=50)
LDL Cholesterol (Calc): 134 mg/dL (calc) — ABNORMAL HIGH
Non-HDL Cholesterol (Calc): 168 mg/dL (calc) — ABNORMAL HIGH (ref <130)
Total CHOL/HDL Ratio: 4.4 (calc) (ref <5.0)
Triglycerides: 204 mg/dL — ABNORMAL HIGH (ref <150)

## 2022-05-16 LAB — CORTISOL: Cortisol, Plasma: 16.1 ug/dL

## 2022-05-16 LAB — CBC
HCT: 42.5 % (ref 35.0–45.0)
Hemoglobin: 14.4 g/dL (ref 11.7–15.5)
MCH: 30.8 pg (ref 27.0–33.0)
MCHC: 33.9 g/dL (ref 32.0–36.0)
MCV: 91 fL (ref 80.0–100.0)
MPV: 11.4 fL (ref 7.5–12.5)
Platelets: 320 10*3/uL (ref 140–400)
RBC: 4.67 10*6/uL (ref 3.80–5.10)
RDW: 12.3 % (ref 11.0–15.0)
WBC: 6.7 10*3/uL (ref 3.8–10.8)

## 2022-05-16 LAB — HEMOGLOBIN A1C
Hgb A1c MFr Bld: 5.4 % of total Hgb (ref <5.7)
Mean Plasma Glucose: 108 mg/dL
eAG (mmol/L): 6 mmol/L

## 2022-05-16 LAB — DHEA-SULFATE: DHEA-SO4: 190 ug/dL (ref 14–349)

## 2022-05-16 LAB — COMPREHENSIVE METABOLIC PANEL
AG Ratio: 1.7 (calc) (ref 1.0–2.5)
ALT: 31 U/L — ABNORMAL HIGH (ref 6–29)
AST: 23 U/L (ref 10–30)
Albumin: 4.5 g/dL (ref 3.6–5.1)
Alkaline phosphatase (APISO): 60 U/L (ref 31–125)
BUN: 15 mg/dL (ref 7–25)
CO2: 24 mmol/L (ref 20–32)
Calcium: 9.7 mg/dL (ref 8.6–10.2)
Chloride: 105 mmol/L (ref 98–110)
Creat: 0.9 mg/dL (ref 0.50–0.96)
Globulin: 2.7 g/dL (calc) (ref 1.9–3.7)
Glucose, Bld: 97 mg/dL (ref 65–99)
Potassium: 4.2 mmol/L (ref 3.5–5.3)
Sodium: 141 mmol/L (ref 135–146)
Total Bilirubin: 0.4 mg/dL (ref 0.2–1.2)
Total Protein: 7.2 g/dL (ref 6.1–8.1)

## 2022-05-16 LAB — FSH/LH
FSH: 12.2 m[IU]/mL
LH: 12.3 m[IU]/mL

## 2022-05-16 LAB — RPR: RPR Ser Ql: NONREACTIVE

## 2022-05-16 LAB — TSH: TSH: 1.26 mIU/L

## 2022-05-16 LAB — HIV ANTIBODY (ROUTINE TESTING W REFLEX): HIV 1&2 Ab, 4th Generation: NONREACTIVE

## 2022-05-16 LAB — HEPATITIS C ANTIBODY: Hepatitis C Ab: NONREACTIVE

## 2022-05-16 LAB — ESTRADIOL: Estradiol: 27 pg/mL

## 2022-05-16 LAB — PROLACTIN: Prolactin: 7.6 ng/mL

## 2022-05-16 NOTE — Telephone Encounter (Signed)
PT called pt yesterday and LVM for her to CB and schedule.

## 2022-05-18 LAB — 17-HYDROXYPROGESTERONE: 17-OH-Progesterone, LC/MS/MS: 25 ng/dL

## 2022-05-20 NOTE — Progress Notes (Signed)
GYNECOLOGY  VISIT   HPI: 22 y.o.   Single  Caucasian  female   G0P0000 with Patient's last menstrual period was 05/11/2022.   here for follow up of labs and pelvic US.  Pain with intercourse and some increased cramping.  IUD not painful for patient.   Previously had Kyleena IUD.   Hx migraine with aura.  Has black spots prior to her HAs.   GYNECOLOGIC HISTORY: Patient's last menstrual period was 05/11/2022. Contraception:  IUD-Paraguard-04/03/21 Menopausal hormone therapy:  n/a Last mammogram:  n/a Last pap smear:   05/15/22 ASCUS: HR HPV neg, 02/22/21 LSIL        OB History     Gravida  0   Para  0   Term  0   Preterm  0   AB  0   Living  0      SAB  0   IAB  0   Ectopic  0   Multiple  0   Live Births                 Patient Active Problem List   Diagnosis Date Noted   Episodic tension-type headache, not intractable 03/05/2017   Daytime somnolence 12/04/2016   Acanthosis nigricans 08/13/2016   Obesity 08/13/2016   Abdominal pain 08/03/2014   Dysmenorrhea 05/31/2014   Obesity (BMI 30-39.9) 12/28/2013   Migraine without aura 11/30/2013   CHI (closed head injury) 02/09/2013    Past Medical History:  Diagnosis Date   Abnormal uterine bleeding    Anxiety    Concussion 04/05/2013   volleyball   Elevated serum creatinine 2020   Hyperlipidemia    LGSIL on Pap smear of cervix 02/2021   Low ferritin 2019   Migraines    Ovarian cyst     Past Surgical History:  Procedure Laterality Date   BREAST REDUCTION SURGERY  04/12/2020   BREAST SURGERY     removal of fibroadenomas   CYSTOSCOPY  11/16/2014   [facility]   LAPAROSCOPY N/A 08/16/2014   Procedure: LAPAROSCOPY DIAGNOSTIC, BIOPSY OF POSTERIOR CUL DE [place];  Surgeon: Donnamae Jude, MD;  Location: [facility];  Service: Gynecology;  Laterality: N/A;   PELVIC LAPAROSCOPY  08/12/2014   Dr. Shelbie Ammons for endometriosis    Current Outpatient Medications  Medication Sig Dispense Refill    amphetamine-dextroamphetamine (ADDERALL XR) 15 MG 24 hr capsule Take 1 tablet by mouth every morning.     buPROPion (WELLBUTRIN XL) 300 MG 24 hr tablet Take 300 mg by mouth every morning.     escitalopram (LEXAPRO) 10 MG tablet Take 10 mg by mouth daily.     ibuprofen (ADVIL) 200 MG tablet Take 3 tablets by mouth 3 (three) times daily.     lamoTRIgine (LAMICTAL) 150 MG tablet Take 150 mg by mouth daily.     rizatriptan (MAXALT-MLT) 10 MG disintegrating tablet Take 1 tablet (10 mg total) by mouth as needed for migraine. May repeat in 2 hours if needed 10 tablet 5   topiramate ER (QUDEXY XR) 150 MG CS24 sprinkle capsule Take 150 mg by mouth at bedtime.     zolpidem (AMBIEN) 5 MG tablet Take 5 mg by mouth at bedtime as needed.     No current facility-administered medications for this visit.     ALLERGIES: Patient has no known allergies.  Family History  Problem Relation Age of Onset   Cancer Paternal Grandfather        dec 69 Leukemia   Breast cancer Paternal  Grandmother 16   Seizures Mother        Epilepsy as child   Diabetes Maternal Grandfather    Thyroid disease Maternal Grandmother        hyperthyroid    Social History   Socioeconomic History   Marital status: Single    Spouse name: Not on file   Number of children: Not on file   Years of education: Not on file   Highest education level: Not on file  Occupational History   Not on file  Tobacco Use   Smoking status: Never   Smokeless tobacco: Never  Vaping Use   Vaping Use: Never used  Substance and Sexual Activity   Alcohol use: Yes    Alcohol/week: 3.0 standard drinks of alcohol    Types: 3 Standard drinks or equivalent per week   Drug use: No   Sexual activity: Not Currently    Birth control/protection: Pill    Comment: Kyleena IUD 04-19-19  Other Topics Concern   Not on file  [place]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   She attended [employer].    She lives on campus during  school.    She enjoys basketball, soccer, mission trips, and doing make-up.   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   Social Determinants of Health   Financial Resource Strain: Not on file  Food Insecurity: Not on file  Transportation Needs: Not on file  Physical Activity: Not on file  Stress: Not on file  Social Connections: Not on file  Intimate Partner Violence: Not on file    Review of Systems  All other systems reviewed and are negative.   PHYSICAL EXAMINATION:    BP 120/72   Ht '5\' 8"'$  (1.727 m)   Wt 254 lb (115.2 kg)   LMP 05/11/2022   BMI 38.62 kg/m     General appearance: alert, cooperative and appears stated age   Pelvic US 05/30/22 Uterus 7.04 x 4.27 x 3.83 cm.  No myometrial masses. IUD low in uterine segment and left ar appears to protrude into the myometrium.  EMS 3.91 mm. Left ovary 2.61 x 1.45 x 1.85 cm.  Right ovary 4.18 x 1.90 x 1.97 cm.  No adnexal masses.  No free fluid.  ASSESSMENT  Hirsutism.  Elevated LFT.  Elevated cholesterol and TG.  AGUS pap and negative HR HPV.   Prior pap LGSIL.  Dyspareunia. Malpositioned IUD.   Contraceptive management.  Migraine with aura.   PLAN  We discussed her recent serum labs. Will check testosterone and LFTs today.  Follow diet low in cholesterol and saturated fat and increase exercise to lower lipids.  Next pap in one year.  We discussed removal of her current IUD and using Micronor or doing an IUD exchange with US guidance (new ParaGard or Progesterone IUD) Use condoms currently to increase efficacy of pregnancy prevention.  I would avoid estrogen containing contraception at this time.    An After Visit Summary was printed and given to the patient.  31 min  total time was spent for this patient encounter, including preparation, face-to-face counseling with the patient, coordination of care, and documentation of the encounter.

## 2022-05-21 LAB — CYTOLOGY - PAP
Chlamydia: NEGATIVE
Comment: NEGATIVE
Comment: NEGATIVE
Comment: NORMAL
Diagnosis: UNDETERMINED — AB
Neisseria Gonorrhea: NEGATIVE
Trichomonas: NEGATIVE

## 2022-05-21 NOTE — Telephone Encounter (Signed)
Encounter reviewed and closed.  

## 2022-05-21 NOTE — Telephone Encounter (Signed)
Patient has appt scheduled 05/23/22 at 3:30pm.

## 2022-05-23 ENCOUNTER — Other Ambulatory Visit: Payer: Self-pay

## 2022-05-23 ENCOUNTER — Ambulatory Visit: Payer: BC Managed Care – PPO | Attending: Obstetrics and Gynecology

## 2022-05-23 DIAGNOSIS — N941 Unspecified dyspareunia: Secondary | ICD-10-CM | POA: Insufficient documentation

## 2022-05-23 DIAGNOSIS — R279 Unspecified lack of coordination: Secondary | ICD-10-CM | POA: Diagnosis not present

## 2022-05-23 DIAGNOSIS — N3946 Mixed incontinence: Secondary | ICD-10-CM | POA: Diagnosis not present

## 2022-05-23 DIAGNOSIS — M6281 Muscle weakness (generalized): Secondary | ICD-10-CM | POA: Insufficient documentation

## 2022-05-23 DIAGNOSIS — M62838 Other muscle spasm: Secondary | ICD-10-CM | POA: Diagnosis not present

## 2022-05-23 DIAGNOSIS — M6289 Other specified disorders of muscle: Secondary | ICD-10-CM | POA: Diagnosis not present

## 2022-05-23 NOTE — Therapy (Signed)
OUTPATIENT PHYSICAL THERAPY FEMALE PELVIC EVALUATION   Patient Name: Rebecca Dunn MRN: [id number] DOB:[birth year], 22 y.o., female Today's Date: 05/23/2022  END OF SESSION:  PT End of Session - 05/23/22 1546     Visit Number 1    Date for PT Re-Evaluation 08/15/22    Authorization Type BCBS    PT Start Time 1541    PT Stop Time 1615    PT Time Calculation (min) 34 min    Activity Tolerance Patient tolerated treatment well    Behavior During Therapy [facility] for tasks assessed/performed             Past Medical History:  Diagnosis Date   Abnormal uterine bleeding    Anxiety    Concussion 04/05/2013   volleyball   Elevated serum creatinine 2020   Hyperlipidemia    LGSIL on Pap smear of cervix 02/2021   Low ferritin 2019   Migraines    Ovarian cyst    Past Surgical History:  Procedure Laterality Date   BREAST REDUCTION SURGERY  04/12/2020   BREAST SURGERY     removal of fibroadenomas   CYSTOSCOPY  11/16/2014   [facility]   LAPAROSCOPY N/A 08/16/2014   Procedure: LAPAROSCOPY DIAGNOSTIC, BIOPSY OF POSTERIOR CUL DE [place];  Surgeon: Donnamae Jude, MD;  Location: [facility];  Service: Gynecology;  Laterality: N/A;   PELVIC LAPAROSCOPY  08/12/2014   Dr. Shelbie Ammons for endometriosis   Patient Active Problem List   Diagnosis Date Noted   Episodic tension-type headache, not intractable 03/05/2017   Daytime somnolence 12/04/2016   Acanthosis nigricans 08/13/2016   Obesity 08/13/2016   Abdominal pain 08/03/2014   Dysmenorrhea 05/31/2014   Obesity (BMI 30-39.9) 12/28/2013   Migraine without aura 11/30/2013   CHI (closed head injury) 02/09/2013    PCP: Nickola Major, MD  REFERRING PROVIDER: Nunzio Cobbs, MD  REFERRING DIAG: N94.10 (ICD-10-CM) - Dyspareunia in female  THERAPY DIAG:  Pelvic floor dysfunction  Other muscle spasm  Muscle weakness (generalized)  Unspecified lack of coordination  Mixed incontinence  Dyspareunia in female  Rationale  for Evaluation and Treatment: Rehabilitation  ONSET DATE: 8 months ago  SUBJECTIVE:                                                                                                                                                                                           05/23/22 SUBJECTIVE STATEMENT: Pt is having pain with intercourse that feels like a stabbing pain. This has been with multiple partners, not just one. She has chronic pelvic pain since starting her menstrual cycle when she was 11 Fluid intake:  Yes: 6 water bottles a day    PAIN:  Are you having pain? Yes NPRS scale: 7/10 Pain location: Vaginal  Pain type: sharp Pain description: intermittent   Aggravating factors: intercourse; all 4s Relieving factors: not ha  PRECAUTIONS: None  WEIGHT BEARING RESTRICTIONS: No  FALLS:  Has patient fallen in last 6 months? No  LIVING ENVIRONMENT: Lives with: lives with their family Lives in: House/apartment   OCCUPATION: studied Pharmacologist, just graduated   PLOF: Independent  PATIENT GOALS: pain to stop during intercourse  PERTINENT HISTORY:  Laparoscopy for possible endometriosis 2016, breast reduction 2016 Sexual abuse: No  BOWEL MOVEMENT: Pain with bowel movement: No Type of bowel movement:Frequency 2x/day and Strain Yes Fully empty rectum: Yes: - Leakage: No Pads: No Fiber supplement: No  URINATION: Pain with urination: No Fully empty bladder: No Stream: Strong Urgency: Yes: sometimes  Frequency: at least 1x every hour Leakage: Urge to void, Coughing, Sneezing, and Laughing Pads: Yes: sometimes panty liner, leaking worse around menstrual cycle  INTERCOURSE: Pain with intercourse: During Penetration Ability to have vaginal penetration:  Yes: with pain Climax: can have an orgasm on her own, but not with partner due to pain - used to be able to have an orgasm Marinoff Scale: 3/3 *tampon can hurt; gynecological exam  PREGNANCY: Vaginal deliveries  0 Tearing No C-section deliveries 0 Currently pregnant No  PROLAPSE: None   OBJECTIVE:  05/23/22  COGNITION: Overall cognitive status: Within functional limits for tasks assessed     SENSATION: Light touch: Appears intact Proprioception: Appears intact  MUSCLE LENGTH:   FUNCTIONAL TESTS:    GAIT: Comments: WNL  POSTURE:  WNL  PELVIC ALIGNMENT:  LUMBARAROM/PROM:  A/PROM A/PROM  eval  Flexion   Extension   Right lateral flexion   Left lateral flexion   Right rotation   Left rotation    (Blank rows = not tested)  LOWER EXTREMITY ROM:   LOWER EXTREMITY MMT:  MMT Right eval Left eval  Hip flexion    Hip extension    Hip abduction    Hip adduction    Hip internal rotation    Hip external rotation    Knee flexion    Knee extension    Ankle dorsiflexion    Ankle plantarflexion    Ankle inversion    Ankle eversion     PALPATION:   General  tenderness throughout lower abdomen; rib cage and diaphragmatic restriction                External Perineal Exam WNL                             Internal Pelvic Floor significant Lt sided tightness in superficial pelvic floor; restriction and discomfort throughout bil deep pelvic floor, more notably on Lt  Patient confirms identification and approves PT to assess internal pelvic floor and treatment Yes  PELVIC MMT:   MMT eval  Vaginal 2/5, 3 second endruance, 5 repeat contractions   Internal Anal Sphincter   External Anal Sphincter   Puborectalis   Diastasis Recti None noted  (Blank rows = not tested)        TONE: High tone; Lt>Rt  PROLAPSE: WNL  TODAY'S TREATMENT:  DATE: 05/23/22  EVAL  Neuromuscular re-education: Diaphragmatic breathing Cat cow Child's pose Happy baby    PATIENT EDUCATION:  Education details: initial HEP Person educated: Patient Education method:  Explanation, Demonstration, Tactile cues, Verbal cues, and Handouts Education comprehension: verbalized understanding  HOME EXERCISE PROGRAM: [id number]  ASSESSMENT:  CLINICAL IMPRESSION: Patient is a 22 y.o. female who was seen today for physical therapy evaluation and treatment for dyspareunia, pelvic pain, and urinary incontinence/frequency. Exam findings notable for lower abdominal restriction and tenderness, decreased thoracic and diaphragmatic mobility, adductor restriction/tightness, superficial pelvic floor tenderness on Lt, deep pelvic floor tenderness throughout with referral pain into low back, high tone in bil deep pelvic floor (Lt>Rt), pelvic floor weakness, poor coordination of pelvic floor contraction, and little pelvic floor endurance. Signs and symptoms could very likely be associated with underlying endometriosis; many of her symptoms are consistent with pelvic floor tightness and fascial restriction throughout abdomen and pelvic. Initial treatment consisted of diaphragmatic breathing and down training with good tolerance. She will continue to benefit from skilled PT intervention in order to decrease dyspareunia, improve pelvic pain, and reduce urinary frequency/incontinence.    OBJECTIVE IMPAIRMENTS: decreased activity tolerance, decreased coordination, decreased endurance, decreased strength, increased fascial restrictions, increased muscle spasms, impaired tone, postural dysfunction, and pain.   ACTIVITY LIMITATIONS: continence and intercourse  PARTICIPATION LIMITATIONS: interpersonal relationship and community activity  PERSONAL FACTORS: 1 comorbidity: Laparoscopy for possible endometriosis 2016, breast reduction 2016  are also affecting patient's functional outcome.   REHAB POTENTIAL: Good  CLINICAL DECISION MAKING: Stable/uncomplicated  EVALUATION COMPLEXITY: Low   GOALS: Goals reviewed with patient? Yes  SHORT TERM GOALS: Target date: 06/27/22  Pt will be  independent with HEP.   Baseline: Goal status: INITIAL  2.  Pt will be independent with diaphragmatic breathing and down training activities in order to improve pelvic floor relaxation.  Baseline:  Goal status: INITIAL  3.  Pt will be independent with the knack, urge suppression technique, and double voiding in order to improve bladder habits and decrease urinary incontinence.   Baseline:  Goal status: INITIAL     LONG TERM GOALS: Target date: 08/15/22  Pt will be independent with advanced HEP.   Baseline:  Goal status: INITIAL  2.  Pt will demonstrate normal pelvic floor muscle tone and A/ROM, able to achieve 3/5 strength with contractions and 10 sec endurance, in order to provide appropriate lumbopelvic support in functional activities.   Baseline:  Goal status: INITIAL  3.  Pt will report 0/10 pain with vaginal penetration in order to improve intimate relationship with partner.    Baseline:  Goal status: INITIAL  4.  Pt will be able to go 2-3 hours in between voids without urgency or incontinence in order to improve QOL and perform all functional activities with less difficulty.   Baseline:  Goal status: INITIAL  5.  Pt will report no leaks with laughing, coughing, sneezing in order to improve comfort with interpersonal relationships and community activities.   Baseline:  Goal status: INITIAL  6.  Pt will report no pelvic pain greater than 2/10.  Baseline:  Goal status: INITIAL  7.  Pt will be able to tolerate tampon use and gynecological exam without pain greater than 2/10. Baseline:  Goal status: INITIAL  PLAN:  PT FREQUENCY: 1-2x/week  PT DURATION: 12 weeks  PLANNED INTERVENTIONS: Therapeutic exercises, Therapeutic activity, Neuromuscular re-education, Balance training, Gait training, Patient/Family education, Self Care, Joint mobilization, Dry Needling, Biofeedback, and Manual therapy  PLAN FOR NEXT SESSION:  Pelvic floor muscle release; abdominal  myofascial release; mobility/down training progressions.   Heather Roberts, PT, DPT01/11/248:33 PM

## 2022-05-26 ENCOUNTER — Other Ambulatory Visit: Payer: Self-pay | Admitting: Obstetrics and Gynecology

## 2022-05-26 DIAGNOSIS — R7989 Other specified abnormal findings of blood chemistry: Secondary | ICD-10-CM

## 2022-05-26 DIAGNOSIS — L680 Hirsutism: Secondary | ICD-10-CM

## 2022-05-30 ENCOUNTER — Ambulatory Visit (INDEPENDENT_AMBULATORY_CARE_PROVIDER_SITE_OTHER): Payer: BC Managed Care – PPO

## 2022-05-30 DIAGNOSIS — N941 Unspecified dyspareunia: Secondary | ICD-10-CM

## 2022-06-03 ENCOUNTER — Encounter: Payer: Self-pay | Admitting: Obstetrics and Gynecology

## 2022-06-03 ENCOUNTER — Ambulatory Visit: Payer: BC Managed Care – PPO | Admitting: Obstetrics and Gynecology

## 2022-06-03 VITALS — BP 120/72 | Ht 68.0 in | Wt 254.0 lb

## 2022-06-03 DIAGNOSIS — T8332XA Displacement of intrauterine contraceptive device, initial encounter: Secondary | ICD-10-CM | POA: Diagnosis not present

## 2022-06-03 DIAGNOSIS — R87610 Atypical squamous cells of undetermined significance on cytologic smear of cervix (ASC-US): Secondary | ICD-10-CM

## 2022-06-03 DIAGNOSIS — R7989 Other specified abnormal findings of blood chemistry: Secondary | ICD-10-CM | POA: Diagnosis not present

## 2022-06-03 DIAGNOSIS — L680 Hirsutism: Secondary | ICD-10-CM

## 2022-06-03 DIAGNOSIS — Z308 Encounter for other contraceptive management: Secondary | ICD-10-CM

## 2022-06-04 ENCOUNTER — Encounter: Payer: Self-pay | Admitting: Obstetrics and Gynecology

## 2022-06-05 NOTE — Telephone Encounter (Signed)
Please contact patient in follow up to her request to do an IUD exchange in the OR.   As I am not doing surgical care starting 05/13/22, Rebecca Dunn will need to see another GYN surgeon.   She may make an appointment to see Dr. Dellis Filbert at this office to schedule an IUD exchange while under anesthesia.

## 2022-06-06 NOTE — Telephone Encounter (Signed)
Spoke with patient.  OV scheduled for pre-op and consult with Dr. Dellis Filbert on 06/28/22 at 12pm.  Will plan for IUD exchange in OR 07/26/22. Advised patient I will return call once pre-op completed with Dr. Dellis Filbert and surgery request received. Patient agreeable. Future appt cancelled with Dr. Quincy Simmonds.   Routing to provider for final review. Patient is agreeable to disposition. Will close encounter.  Cc: Dr. Dellis Filbert

## 2022-06-07 LAB — HEPATIC FUNCTION PANEL
AG Ratio: 1.7 (calc) (ref 1.0–2.5)
ALT: 18 U/L (ref 6–29)
AST: 12 U/L (ref 10–30)
Albumin: 4.3 g/dL (ref 3.6–5.1)
Alkaline phosphatase (APISO): 53 U/L (ref 31–125)
Bilirubin, Direct: 0.1 mg/dL (ref 0.0–0.2)
Globulin: 2.6 g/dL (calc) (ref 1.9–3.7)
Indirect Bilirubin: 0.5 mg/dL (calc) (ref 0.2–1.2)
Total Bilirubin: 0.6 mg/dL (ref 0.2–1.2)
Total Protein: 6.9 g/dL (ref 6.1–8.1)

## 2022-06-07 LAB — TESTOS,TOTAL,FREE AND SHBG (FEMALE)
Free Testosterone: 3 pg/mL (ref 0.1–6.4)
Sex Hormone Binding: 44 nmol/L (ref 17–124)
Testosterone, Total, LC-MS-MS: 26 ng/dL (ref 2–45)

## 2022-06-14 DIAGNOSIS — F902 Attention-deficit hyperactivity disorder, combined type: Secondary | ICD-10-CM | POA: Diagnosis not present

## 2022-06-14 DIAGNOSIS — F339 Major depressive disorder, recurrent, unspecified: Secondary | ICD-10-CM | POA: Diagnosis not present

## 2022-06-14 DIAGNOSIS — F411 Generalized anxiety disorder: Secondary | ICD-10-CM | POA: Diagnosis not present

## 2022-06-14 DIAGNOSIS — F4010 Social phobia, unspecified: Secondary | ICD-10-CM | POA: Diagnosis not present

## 2022-06-17 ENCOUNTER — Ambulatory Visit: Payer: BC Managed Care – PPO | Admitting: Obstetrics and Gynecology

## 2022-06-28 ENCOUNTER — Encounter: Payer: BC Managed Care – PPO | Admitting: Obstetrics & Gynecology

## 2022-07-04 ENCOUNTER — Ambulatory Visit: Payer: BC Managed Care – PPO

## 2022-07-04 ENCOUNTER — Telehealth: Payer: Self-pay | Admitting: *Deleted

## 2022-07-04 NOTE — Telephone Encounter (Signed)
-----   Message from Princess Bruins, MD sent at 07/04/2022  1:50 PM EST ----- Regarding: RE: Surgery As discussed, schedule with me asap to attempt removal of IUD in office.  Further planning to be decided at that visit. Dr Carlean Jews ----- Message ----- From: Burnice Logan, RN Sent: 07/04/2022   8:29 AM EST To: Princess Bruins, MD; # Subject: Surgery                                        Dr. Dellis Filbert,   This is Dr. Elza Rafter patient we spoke about on Tuesday regarding IUD exchange in the OR. Patient is scheduled for pre-op with you on 07/12/22, I had held 3/15 for surgery. Can you review chart and advise if ok to proceed as scheduled? Patient has called to inquire.    Thanks,   Sharee Pimple

## 2022-07-04 NOTE — Telephone Encounter (Signed)
Rebecca Bruins, MD  You23 minutes ago (3:48 PM)    [facility], keep appointment as is and will see on exam at that visit. Dr Hildred Priest with patient, advised per Dr. Dellis Filbert. Patient will keep OV as scheduled for 07/12/22. Patient aware time held for surgery 3/15. Patient is aware surgery will not be scheduled until visit with Dr. Dellis Filbert. I will f/u after OV. Patient verbalizes understanding and is agreeable.   Routing to Dr. Dellis Filbert to provider surgery request after OV.

## 2022-07-04 NOTE — Telephone Encounter (Signed)
Spoke with patient.  Advised per Dr. Dellis Filbert.  Patient declines to attempt removal of IUD in office. States she discussed concerns with Dr. Quincy Simmonds, states "I do not think I can handle it". States she is requesting to proceed with IUD exchange in OR under sedation.   Advised patient I will provide update to Dr. Dellis Filbert and f/u with recommendations.

## 2022-07-09 DIAGNOSIS — M546 Pain in thoracic spine: Secondary | ICD-10-CM | POA: Diagnosis not present

## 2022-07-09 DIAGNOSIS — M9901 Segmental and somatic dysfunction of cervical region: Secondary | ICD-10-CM | POA: Diagnosis not present

## 2022-07-09 DIAGNOSIS — M9902 Segmental and somatic dysfunction of thoracic region: Secondary | ICD-10-CM | POA: Diagnosis not present

## 2022-07-09 DIAGNOSIS — M542 Cervicalgia: Secondary | ICD-10-CM | POA: Diagnosis not present

## 2022-07-10 DIAGNOSIS — M546 Pain in thoracic spine: Secondary | ICD-10-CM | POA: Diagnosis not present

## 2022-07-10 DIAGNOSIS — M9901 Segmental and somatic dysfunction of cervical region: Secondary | ICD-10-CM | POA: Diagnosis not present

## 2022-07-10 DIAGNOSIS — M542 Cervicalgia: Secondary | ICD-10-CM | POA: Diagnosis not present

## 2022-07-10 DIAGNOSIS — M9902 Segmental and somatic dysfunction of thoracic region: Secondary | ICD-10-CM | POA: Diagnosis not present

## 2022-07-12 ENCOUNTER — Ambulatory Visit (INDEPENDENT_AMBULATORY_CARE_PROVIDER_SITE_OTHER): Payer: BC Managed Care – PPO | Admitting: Obstetrics & Gynecology

## 2022-07-12 ENCOUNTER — Encounter: Payer: Self-pay | Admitting: Obstetrics & Gynecology

## 2022-07-12 VITALS — BP 110/70 | HR 96 | Ht 67.25 in | Wt 254.0 lb

## 2022-07-12 DIAGNOSIS — M9902 Segmental and somatic dysfunction of thoracic region: Secondary | ICD-10-CM | POA: Diagnosis not present

## 2022-07-12 DIAGNOSIS — M9901 Segmental and somatic dysfunction of cervical region: Secondary | ICD-10-CM | POA: Diagnosis not present

## 2022-07-12 DIAGNOSIS — Z30432 Encounter for removal of intrauterine contraceptive device: Secondary | ICD-10-CM

## 2022-07-12 DIAGNOSIS — Z3009 Encounter for other general counseling and advice on contraception: Secondary | ICD-10-CM | POA: Diagnosis not present

## 2022-07-12 DIAGNOSIS — T8332XD Displacement of intrauterine contraceptive device, subsequent encounter: Secondary | ICD-10-CM

## 2022-07-12 DIAGNOSIS — M542 Cervicalgia: Secondary | ICD-10-CM | POA: Diagnosis not present

## 2022-07-12 DIAGNOSIS — M546 Pain in thoracic spine: Secondary | ICD-10-CM | POA: Diagnosis not present

## 2022-07-12 DIAGNOSIS — R0683 Snoring: Secondary | ICD-10-CM | POA: Diagnosis not present

## 2022-07-12 NOTE — Telephone Encounter (Signed)
-----   Message from Princess Bruins, MD sent at 07/12/2022 11:55 AM EST ----- Regarding: Paragard IUD with OR time 07/26/22 Successful removal of Paragard IUD in office today.  Patient will call back with preference for new Paragard IUD insertion: Office without [place], Office with [place] or [facility] under sedation.

## 2022-07-12 NOTE — Progress Notes (Signed)
    Rebecca Dunn 04/06/2001 [id number]        22 y.o.  G0  RP: Displaced Paragard IUD  HPI: Patient experiencing dyspareunia and increased cramping.  A pelvic US was done on 05/30/22 showing the Paragard IUD low in uterine segment and left arm appears to protrude into the myometrium.  Paragard IUD in place x 04/03/21. Has migraines with aura.  CI to hormonal contraception.  LMP 06/28/22 normal.  Patient has OR time reserved 07/26/22 for IUD removal/insertion of new Paragard if needed.  Pelvic US 05/30/22:  Narrative & Impression  Indication:  Dyspareunia, use of Paragard IUD.    Findings: Pelvic US 05/30/22 Uterus 7.04 x 4.27 x 3.83 cm.  No myometrial masses. IUD low in uterine segment and left arm appears to protrude into the myometrium.  EMS 3.91 mm. Left ovary 2.61 x 1.45 x 1.85 cm.  Right ovary 4.18 x 1.90 x 1.97 cm.  No adnexal masses.  No free fluid.   Impression:  malpositioned IUD, normal ovaries.     OB History  Gravida Para Term Preterm AB Living  0 0 0 0 0 0  SAB IAB Ectopic Multiple Live Births  0 0 0 0      Past medical history,surgical history, problem list, medications, allergies, family history and social history were all reviewed and documented in the EPIC chart.   Directed ROS with pertinent positives and negatives documented in the history of present illness/assessment and plan.  Exam:  Vitals:   07/12/22 1017  BP: 110/70  Pulse: 96  SpO2: 99%  Weight: 254 lb (115.2 kg)  Height: 5' 7.25" (1.708 m)   General appearance:  Normal   Gynecologic exam: Vulva normal.  Speculum:  Cervix/Vagina normal.  IUD strings visible at EO.  Normal secretions/no blood.  Verbal informed consent obtained for IUD removal attempt.  IUD removal clamp used.  Minor resistance at first, but easy removal by pulling on strings, IUD removed intact and complete.  Shown to patient and discarded.  Well tolerated, no Cx.   Assessment/Plan:  22 y.o. G0   1. Malpositioned  intrauterine device (IUD), subsequent encounter  Patient experiencing dyspareunia and increased cramping.  A pelvic US was done on 05/30/22 showing the Paragard IUD low in uterine segment and left arm appears to protrude into the myometrium.  Paragard IUD in place x 04/03/21. Has migraines with aura.  CI to hormonal contraception.  LMP 06/28/22 normal.   Successful Paragard IUD removal by pulling on strings.  IUD intact and complete.  Well tolerated, no Cx.  Patient has OR time reserved 07/26/22 for IUD insertion of new Paragard if needed.  2. Encounter for IUD removal Successful Paragard IUD removal by pulling on strings.  IUD intact and complete.  Well tolerated, no Cx.  Post procedure precautions.  3. Encounter for counseling regarding contraception  Successful removal of Paragard IUD.  She would like to insert a new Paragard IUD.  Offered 3 settings for IUD insertion:  In office insertion after Ibuprofen 800 mg about 2 hours before, with hurricane anesthesia and using the os finder or office IUD insertion under US guidance or [facility] short stay IUD insertion under sedation.  Patient will call back with decision.   Princess Bruins MD, 10:25 AM 07/12/2022

## 2022-07-12 NOTE — Telephone Encounter (Signed)
Call returned to patient, left message advising to return call to Makelle Marrone,RN at [phone]. Advised patient I will be out of the office this afternoon, returning Monday morning at 8am.

## 2022-07-15 ENCOUNTER — Encounter: Payer: Self-pay | Admitting: Obstetrics and Gynecology

## 2022-07-15 DIAGNOSIS — M542 Cervicalgia: Secondary | ICD-10-CM | POA: Diagnosis not present

## 2022-07-15 DIAGNOSIS — M9901 Segmental and somatic dysfunction of cervical region: Secondary | ICD-10-CM | POA: Diagnosis not present

## 2022-07-15 DIAGNOSIS — M546 Pain in thoracic spine: Secondary | ICD-10-CM | POA: Diagnosis not present

## 2022-07-15 DIAGNOSIS — M9902 Segmental and somatic dysfunction of thoracic region: Secondary | ICD-10-CM | POA: Diagnosis not present

## 2022-07-15 NOTE — Telephone Encounter (Signed)
See telephone encounter dated 07/04/22.   Encounter closed.

## 2022-07-15 NOTE — Telephone Encounter (Signed)
Call returned to patient, Left message to call Sharee Pimple, RN at [facility], [phone], OPT 5.

## 2022-07-15 NOTE — Telephone Encounter (Signed)
Rebecca Dunn could start taking Micronor, progesterone only birth control pills if she would like.  Disp:  one pack, RF:  none. This can provide pregnancy prevention and may help with menstrual cramping.  Side effects may be breast tenderness and possible acne.  Usually headaches are not a side effect.   This is a pill that is taken every day of the year and has no placebo pills at the end of the package.   She would need to take this medication on time every day and has only a 3 hour window of time to take it.  It is slightly less effective than birth control that has estrogen and progesterone in it.  The Topamax she takes may also make this birth control pill slightly less effective.   I do recommend condoms and spermicide as a back up form of contraception.    I will see her in the office next week.

## 2022-07-15 NOTE — Telephone Encounter (Signed)
Spoke with patient, advised per Dr. Quincy Simmonds.  Patient declines POP at this time, would like to wait and discuss at [place] with Dr. Quincy Simmonds. Questions answered. Patient appreciative of f/u call.   Routing to provider for final review. Patient is agreeable to disposition. Will close encounter.

## 2022-07-15 NOTE — Telephone Encounter (Signed)
Call returned to patient, Left message to call Sharee Pimple, RN at [facility] [facility], [phone].   Patient returned call. Miranda present for call.  Patient was seen for consult and pre-op with Dr. Dellis Filbert on 07/12/22. OR time held for 07/26/22 for removal of malpositioned IUD and replacement, pending consult with Dr. Dellis Filbert 07/12/22. IUD removed during 07/12/22 visit. Patient expressed concerns with IUD removal in office, request to not proceed with scheduling insertion with Dr. Dellis Filbert at this time. Patient is requesting to schedule OV with Dr. Quincy Simmonds to further discuss IUD options. OV scheduled for 07/22/22 at 1630. Patient declined earlier OV due to work schedule.   Patient reports cramping, lower abdomen, 6/10 on pain scale since IUD removal on 07/12/22. Not relieved by 600 mg OTC motrin TID. Bleeding is light, describes nickel size clots when voiding, does not require a pad. Denies fever/chills, N/V, vag odor or heavy bleeding. LMP 06/28/21.   Advised patient I will cancel IUD exchange in OR held for 07/26/22 and provide update to Dr. Quincy Simmonds. I will return call with any additional recommendations. Questions answered. Patient verbalizes understanding.   Routing to Dr. Quincy Simmonds  Cc: Dr. Dellis Filbert

## 2022-07-15 NOTE — Telephone Encounter (Signed)
Left message to call Briauna Gilmartin, RN at [facility], [phone], OPT 5.  

## 2022-07-17 NOTE — Progress Notes (Unsigned)
GYNECOLOGY  VISIT   HPI: 22 y.o.   Single  Caucasian  female   G0P0000 with Patient's last menstrual period was 06/28/2022 (exact date).   here for   IUD consult.  Patient had surgical consultation with Dr. Dellis Filbert on 07/12/22 for planning for an exchange of Paragard IUD in the OR setting.  Patient had been having pain with intercourse and increased cramping.  Pelvic US 06/03/22 showed the IUD to be low in the uterine segment and with left arm protruding into the myometrium.   Patient had preference for IUD exchange in the operating room.  Patient's IUD was removed by Dr. Dellis Filbert at the visit on 07/12/22.   She had clotting the first few days after IUD removal.  Pain was not intolerable.   No menses since then.   She desires a new Paragard IUD.   GYNECOLOGIC HISTORY: Patient's last menstrual period was 06/28/2022 (exact date). Contraception:  IUD-Paraguard-04/03/21  Menopausal hormone therapy:  n/a Last mammogram:  n/a Last pap smear:   05/15/22 ASCUS: HR HPV neg, 02/22/21 LSIL         OB History     Gravida  0   Para  0   Term  0   Preterm  0   AB  0   Living  0      SAB  0   IAB  0   Ectopic  0   Multiple  0   Live Births                 Patient Active Problem List   Diagnosis Date Noted   Episodic tension-type headache, not intractable 03/05/2017   Daytime somnolence 12/04/2016   Acanthosis nigricans 08/13/2016   Obesity 08/13/2016   Abdominal pain 08/03/2014   Dysmenorrhea 05/31/2014   Obesity (BMI 30-39.9) 12/28/2013   Migraine without aura 11/30/2013   CHI (closed head injury) 02/09/2013    Past Medical History:  Diagnosis Date   Abnormal uterine bleeding    Anxiety    Concussion 04/05/2013   volleyball   Elevated serum creatinine 2020   Hyperlipidemia    LGSIL on Pap smear of cervix 02/2021   Low ferritin 2019   Migraines    Ovarian cyst     Past Surgical History:  Procedure Laterality Date   BREAST REDUCTION SURGERY  04/12/2020    BREAST SURGERY     removal of fibroadenomas   CYSTOSCOPY  11/16/2014   [facility]   LAPAROSCOPY N/A 08/16/2014   Procedure: LAPAROSCOPY DIAGNOSTIC, BIOPSY OF POSTERIOR CUL DE [place];  Surgeon: Donnamae Jude, MD;  Location: [facility];  Service: Gynecology;  Laterality: N/A;   PELVIC LAPAROSCOPY  08/12/2014   Dr. Shelbie Ammons for endometriosis    Current Outpatient Medications  Medication Sig Dispense Refill   amphetamine-dextroamphetamine (ADDERALL XR) 15 MG 24 hr capsule Take 1 tablet by mouth every morning.     buPROPion (WELLBUTRIN XL) 300 MG 24 hr tablet Take 300 mg by mouth every morning.     escitalopram (LEXAPRO) 10 MG tablet Take 10 mg by mouth daily.     ibuprofen (ADVIL) 200 MG tablet Take 3 tablets by mouth 3 (three) times daily.     lamoTRIgine (LAMICTAL) 150 MG tablet Take 150 mg by mouth daily.     rizatriptan (MAXALT-MLT) 10 MG disintegrating tablet Take 1 tablet (10 mg total) by mouth as needed for migraine. May repeat in 2 hours if needed 10 tablet 5   topiramate ER (QUDEXY XR)  150 MG CS24 sprinkle capsule Take 150 mg by mouth at bedtime.     No current facility-administered medications for this visit.     ALLERGIES: Patient has no known allergies.  Family History  Problem Relation Age of Onset   Cancer Paternal Grandfather        dec 69 Leukemia   Breast cancer Paternal Grandmother 54   Seizures Mother        Epilepsy as child   Diabetes Maternal Grandfather    Thyroid disease Maternal Grandmother        hyperthyroid    Social History   Socioeconomic History   Marital status: Single    Spouse name: Not on file   Number of children: Not on file   Years of education: Not on file   Highest education level: Not on file  Occupational History   Not on file  Tobacco Use   Smoking status: Never   Smokeless tobacco: Never  Vaping Use   Vaping Use: Never used  Substance and Sexual Activity   Alcohol use: Yes    Alcohol/week: 3.0 standard drinks of alcohol    Types: 3  Standard drinks or equivalent per week   Drug use: No   Sexual activity: Not Currently    Partners: Male    Birth control/protection: I.U.D.    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   She attended [employer].    She lives on campus during school.    She enjoys basketball, soccer, mission trips, and doing make-up.   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   Social Determinants of Health   Financial Resource Strain: Not on file  Food Insecurity: Not on file  Transportation Needs: Not on file  Physical Activity: Not on file  Stress: Not on file  Social Connections: Not on file  Intimate Partner Violence: Not on file    Review of Systems  See HPI.  PHYSICAL EXAMINATION:    BP 112/64   Pulse 67   Ht '5\' 8"'$  (1.727 m)   Wt 256 lb (116.1 kg)   LMP 06/28/2022 (Exact Date) Comment: paragard insertion-not sexually active  SpO2 100%   BMI 38.92 kg/m     General appearance: alert, cooperative and appears stated age.  Tearful at times.   ASSESSMENT  Hx malpositioned IUD.  Desire for new Paragard IUD.    PLAN  Office visit of IUD removal with Dr. Dellis Filbert reviewed.  IUD was noted to be intact after removed.  We reviewed placement of new IUD in the operating room versus office setting. US guided IUD placement in the office setting would be with antianxiety medication, paracervical block and Cytotec.   Patient is requesting to proceed with IUD placement in OR.  Will refer to Dr. Sabra Heck for consultation. Fu for annual exam and prn.   An After Visit Summary was printed and given to the patient.  22 min  total time was spent for this patient encounter, including preparation, face-to-face counseling with the patient, coordination of care, and documentation of the encounter.

## 2022-07-22 ENCOUNTER — Ambulatory Visit: Payer: BC Managed Care – PPO | Admitting: Obstetrics and Gynecology

## 2022-07-22 ENCOUNTER — Encounter: Payer: Self-pay | Admitting: Obstetrics and Gynecology

## 2022-07-22 VITALS — BP 112/64 | HR 67 | Ht 68.0 in | Wt 256.0 lb

## 2022-07-22 DIAGNOSIS — Z308 Encounter for other contraceptive management: Secondary | ICD-10-CM | POA: Diagnosis not present

## 2022-07-23 ENCOUNTER — Telehealth: Payer: Self-pay | Admitting: Obstetrics and Gynecology

## 2022-07-23 DIAGNOSIS — Z3009 Encounter for other general counseling and advice on contraception: Secondary | ICD-10-CM

## 2022-07-23 NOTE — Telephone Encounter (Signed)
Order placed.   Routing to [place] to f/u on referral.  Cc: Dr. Quincy Simmonds  Encounter closed.

## 2022-07-23 NOTE — Telephone Encounter (Signed)
Please make surgical consultation referral to Dr. Edwinna Areola for Paragard IUD placement in the OR setting.

## 2022-07-26 ENCOUNTER — Ambulatory Visit (HOSPITAL_BASED_OUTPATIENT_CLINIC_OR_DEPARTMENT_OTHER): Admit: 2022-07-26 | Payer: BC Managed Care – PPO | Admitting: Obstetrics & Gynecology

## 2022-07-26 ENCOUNTER — Encounter (HOSPITAL_BASED_OUTPATIENT_CLINIC_OR_DEPARTMENT_OTHER): Payer: Self-pay

## 2022-07-26 SURGERY — HYSTEROSCOPY
Anesthesia: Choice

## 2022-07-29 DIAGNOSIS — M542 Cervicalgia: Secondary | ICD-10-CM | POA: Diagnosis not present

## 2022-07-29 DIAGNOSIS — M546 Pain in thoracic spine: Secondary | ICD-10-CM | POA: Diagnosis not present

## 2022-07-29 DIAGNOSIS — M9901 Segmental and somatic dysfunction of cervical region: Secondary | ICD-10-CM | POA: Diagnosis not present

## 2022-07-29 DIAGNOSIS — M9902 Segmental and somatic dysfunction of thoracic region: Secondary | ICD-10-CM | POA: Diagnosis not present

## 2022-08-01 DIAGNOSIS — M542 Cervicalgia: Secondary | ICD-10-CM | POA: Diagnosis not present

## 2022-08-01 DIAGNOSIS — M9902 Segmental and somatic dysfunction of thoracic region: Secondary | ICD-10-CM | POA: Diagnosis not present

## 2022-08-01 DIAGNOSIS — M546 Pain in thoracic spine: Secondary | ICD-10-CM | POA: Diagnosis not present

## 2022-08-01 DIAGNOSIS — M9901 Segmental and somatic dysfunction of cervical region: Secondary | ICD-10-CM | POA: Diagnosis not present

## 2022-08-05 DIAGNOSIS — M542 Cervicalgia: Secondary | ICD-10-CM | POA: Diagnosis not present

## 2022-08-05 DIAGNOSIS — M9902 Segmental and somatic dysfunction of thoracic region: Secondary | ICD-10-CM | POA: Diagnosis not present

## 2022-08-05 DIAGNOSIS — M546 Pain in thoracic spine: Secondary | ICD-10-CM | POA: Diagnosis not present

## 2022-08-05 DIAGNOSIS — M9901 Segmental and somatic dysfunction of cervical region: Secondary | ICD-10-CM | POA: Diagnosis not present

## 2022-08-12 DIAGNOSIS — M9901 Segmental and somatic dysfunction of cervical region: Secondary | ICD-10-CM | POA: Diagnosis not present

## 2022-08-12 DIAGNOSIS — M546 Pain in thoracic spine: Secondary | ICD-10-CM | POA: Diagnosis not present

## 2022-08-12 DIAGNOSIS — M9902 Segmental and somatic dysfunction of thoracic region: Secondary | ICD-10-CM | POA: Diagnosis not present

## 2022-08-12 DIAGNOSIS — M542 Cervicalgia: Secondary | ICD-10-CM | POA: Diagnosis not present

## 2022-08-13 DIAGNOSIS — M9902 Segmental and somatic dysfunction of thoracic region: Secondary | ICD-10-CM | POA: Diagnosis not present

## 2022-08-13 DIAGNOSIS — M546 Pain in thoracic spine: Secondary | ICD-10-CM | POA: Diagnosis not present

## 2022-08-13 DIAGNOSIS — M9901 Segmental and somatic dysfunction of cervical region: Secondary | ICD-10-CM | POA: Diagnosis not present

## 2022-08-13 DIAGNOSIS — M542 Cervicalgia: Secondary | ICD-10-CM | POA: Diagnosis not present

## 2022-08-22 DIAGNOSIS — M9902 Segmental and somatic dysfunction of thoracic region: Secondary | ICD-10-CM | POA: Diagnosis not present

## 2022-08-22 DIAGNOSIS — M542 Cervicalgia: Secondary | ICD-10-CM | POA: Diagnosis not present

## 2022-08-22 DIAGNOSIS — M9901 Segmental and somatic dysfunction of cervical region: Secondary | ICD-10-CM | POA: Diagnosis not present

## 2022-08-22 DIAGNOSIS — M546 Pain in thoracic spine: Secondary | ICD-10-CM | POA: Diagnosis not present

## 2022-08-27 DIAGNOSIS — M542 Cervicalgia: Secondary | ICD-10-CM | POA: Diagnosis not present

## 2022-08-27 DIAGNOSIS — M9901 Segmental and somatic dysfunction of cervical region: Secondary | ICD-10-CM | POA: Diagnosis not present

## 2022-08-27 DIAGNOSIS — M546 Pain in thoracic spine: Secondary | ICD-10-CM | POA: Diagnosis not present

## 2022-08-27 DIAGNOSIS — M9902 Segmental and somatic dysfunction of thoracic region: Secondary | ICD-10-CM | POA: Diagnosis not present

## 2022-09-02 DIAGNOSIS — M546 Pain in thoracic spine: Secondary | ICD-10-CM | POA: Diagnosis not present

## 2022-09-02 DIAGNOSIS — F411 Generalized anxiety disorder: Secondary | ICD-10-CM | POA: Diagnosis not present

## 2022-09-02 DIAGNOSIS — F902 Attention-deficit hyperactivity disorder, combined type: Secondary | ICD-10-CM | POA: Diagnosis not present

## 2022-09-02 DIAGNOSIS — M9902 Segmental and somatic dysfunction of thoracic region: Secondary | ICD-10-CM | POA: Diagnosis not present

## 2022-09-02 DIAGNOSIS — M9901 Segmental and somatic dysfunction of cervical region: Secondary | ICD-10-CM | POA: Diagnosis not present

## 2022-09-02 DIAGNOSIS — M542 Cervicalgia: Secondary | ICD-10-CM | POA: Diagnosis not present

## 2022-09-02 DIAGNOSIS — F339 Major depressive disorder, recurrent, unspecified: Secondary | ICD-10-CM | POA: Diagnosis not present

## 2022-09-09 DIAGNOSIS — M546 Pain in thoracic spine: Secondary | ICD-10-CM | POA: Diagnosis not present

## 2022-09-09 DIAGNOSIS — M9901 Segmental and somatic dysfunction of cervical region: Secondary | ICD-10-CM | POA: Diagnosis not present

## 2022-09-09 DIAGNOSIS — M542 Cervicalgia: Secondary | ICD-10-CM | POA: Diagnosis not present

## 2022-09-09 DIAGNOSIS — M9902 Segmental and somatic dysfunction of thoracic region: Secondary | ICD-10-CM | POA: Diagnosis not present

## 2022-09-11 DIAGNOSIS — M542 Cervicalgia: Secondary | ICD-10-CM | POA: Diagnosis not present

## 2022-09-11 DIAGNOSIS — M9901 Segmental and somatic dysfunction of cervical region: Secondary | ICD-10-CM | POA: Diagnosis not present

## 2022-09-11 DIAGNOSIS — M9902 Segmental and somatic dysfunction of thoracic region: Secondary | ICD-10-CM | POA: Diagnosis not present

## 2022-09-11 DIAGNOSIS — M546 Pain in thoracic spine: Secondary | ICD-10-CM | POA: Diagnosis not present

## 2022-09-18 DIAGNOSIS — M9901 Segmental and somatic dysfunction of cervical region: Secondary | ICD-10-CM | POA: Diagnosis not present

## 2022-09-18 DIAGNOSIS — M542 Cervicalgia: Secondary | ICD-10-CM | POA: Diagnosis not present

## 2022-09-18 DIAGNOSIS — M546 Pain in thoracic spine: Secondary | ICD-10-CM | POA: Diagnosis not present

## 2022-09-18 DIAGNOSIS — M9902 Segmental and somatic dysfunction of thoracic region: Secondary | ICD-10-CM | POA: Diagnosis not present

## 2022-09-20 DIAGNOSIS — M542 Cervicalgia: Secondary | ICD-10-CM | POA: Diagnosis not present

## 2022-09-20 DIAGNOSIS — M9902 Segmental and somatic dysfunction of thoracic region: Secondary | ICD-10-CM | POA: Diagnosis not present

## 2022-09-20 DIAGNOSIS — M9901 Segmental and somatic dysfunction of cervical region: Secondary | ICD-10-CM | POA: Diagnosis not present

## 2022-09-20 DIAGNOSIS — M546 Pain in thoracic spine: Secondary | ICD-10-CM | POA: Diagnosis not present

## 2022-09-24 DIAGNOSIS — M9901 Segmental and somatic dysfunction of cervical region: Secondary | ICD-10-CM | POA: Diagnosis not present

## 2022-09-24 DIAGNOSIS — M9902 Segmental and somatic dysfunction of thoracic region: Secondary | ICD-10-CM | POA: Diagnosis not present

## 2022-09-24 DIAGNOSIS — M546 Pain in thoracic spine: Secondary | ICD-10-CM | POA: Diagnosis not present

## 2022-09-24 DIAGNOSIS — M542 Cervicalgia: Secondary | ICD-10-CM | POA: Diagnosis not present

## 2022-09-26 DIAGNOSIS — M9902 Segmental and somatic dysfunction of thoracic region: Secondary | ICD-10-CM | POA: Diagnosis not present

## 2022-09-26 DIAGNOSIS — M546 Pain in thoracic spine: Secondary | ICD-10-CM | POA: Diagnosis not present

## 2022-09-26 DIAGNOSIS — M9901 Segmental and somatic dysfunction of cervical region: Secondary | ICD-10-CM | POA: Diagnosis not present

## 2022-09-26 DIAGNOSIS — M542 Cervicalgia: Secondary | ICD-10-CM | POA: Diagnosis not present

## 2022-10-03 DIAGNOSIS — M542 Cervicalgia: Secondary | ICD-10-CM | POA: Diagnosis not present

## 2022-10-03 DIAGNOSIS — M546 Pain in thoracic spine: Secondary | ICD-10-CM | POA: Diagnosis not present

## 2022-10-03 DIAGNOSIS — M9901 Segmental and somatic dysfunction of cervical region: Secondary | ICD-10-CM | POA: Diagnosis not present

## 2022-10-03 DIAGNOSIS — M9902 Segmental and somatic dysfunction of thoracic region: Secondary | ICD-10-CM | POA: Diagnosis not present

## 2022-10-10 DIAGNOSIS — M546 Pain in thoracic spine: Secondary | ICD-10-CM | POA: Diagnosis not present

## 2022-10-10 DIAGNOSIS — M542 Cervicalgia: Secondary | ICD-10-CM | POA: Diagnosis not present

## 2022-10-10 DIAGNOSIS — M9901 Segmental and somatic dysfunction of cervical region: Secondary | ICD-10-CM | POA: Diagnosis not present

## 2022-10-10 DIAGNOSIS — M9902 Segmental and somatic dysfunction of thoracic region: Secondary | ICD-10-CM | POA: Diagnosis not present

## 2022-10-11 DIAGNOSIS — M546 Pain in thoracic spine: Secondary | ICD-10-CM | POA: Diagnosis not present

## 2022-10-11 DIAGNOSIS — M542 Cervicalgia: Secondary | ICD-10-CM | POA: Diagnosis not present

## 2022-10-11 DIAGNOSIS — M9902 Segmental and somatic dysfunction of thoracic region: Secondary | ICD-10-CM | POA: Diagnosis not present

## 2022-10-11 DIAGNOSIS — M9901 Segmental and somatic dysfunction of cervical region: Secondary | ICD-10-CM | POA: Diagnosis not present

## 2022-10-14 ENCOUNTER — Encounter (HOSPITAL_BASED_OUTPATIENT_CLINIC_OR_DEPARTMENT_OTHER): Payer: Self-pay | Admitting: Obstetrics & Gynecology

## 2022-10-14 ENCOUNTER — Ambulatory Visit (INDEPENDENT_AMBULATORY_CARE_PROVIDER_SITE_OTHER): Payer: BC Managed Care – PPO | Admitting: Obstetrics & Gynecology

## 2022-10-14 VITALS — BP 117/80 | HR 104 | Ht 68.0 in | Wt 269.8 lb

## 2022-10-14 DIAGNOSIS — Z30431 Encounter for routine checking of intrauterine contraceptive device: Secondary | ICD-10-CM | POA: Diagnosis not present

## 2022-10-14 DIAGNOSIS — R102 Pelvic and perineal pain: Secondary | ICD-10-CM | POA: Diagnosis not present

## 2022-10-14 NOTE — Progress Notes (Signed)
GYNECOLOGY  VISIT  CC:   new patient, consult  HPI: 22 y.o. G0P0000 Single White or Caucasian female here for discussion of IUD placement in the OR.  She originally had a Kyleena IUD 04/2019.  Although she did well with IUD, she decided she wanted one without any hormone.  So, in November, 2022, IUD was removed and Paragard was replaced.  This was accompanied with a significant amount of pain and cramping.  She started having dyspareunia after that placement and ultrasound was done 05/2022 showing malpositioned IUD with arm in myometrium.  Pt was then seen in March.  Her understanding was this was an appt to discussed IUD removal and replacement in the OR but IUD was removed that day and she felt this was against her wishes.  She, again, had a lot of cramping and bleeding.  Does want another Paragard IUD but wants this placed in the OR.  Not currently SA but desires IUD for contraception.  Last episode of contraception was about two weeks after IUD removal.  STD testing negative 05/15/2022.  Pap was ASCUS.  Repeat 1 year recommended.     Past Medical History:  Diagnosis Date   Abnormal uterine bleeding    Anxiety    Concussion 04/05/2013   volleyball   Elevated serum creatinine 2020   Hyperlipidemia    LGSIL on Pap smear of cervix 02/2021   Low ferritin 2019   Migraines    Ovarian cyst     MEDS:   Current Outpatient Medications on File Prior to Visit  Medication Sig Dispense Refill   amphetamine-dextroamphetamine (ADDERALL XR) 15 MG 24 hr capsule Take 1 tablet by mouth every morning.     buPROPion (WELLBUTRIN XL) 300 MG 24 hr tablet Take 300 mg by mouth every morning.     escitalopram (LEXAPRO) 10 MG tablet Take 10 mg by mouth daily.     ibuprofen (ADVIL) 200 MG tablet Take 3 tablets by mouth 3 (three) times daily.     lamoTRIgine (LAMICTAL) 150 MG tablet Take 150 mg by mouth daily.     rizatriptan (MAXALT-MLT) 10 MG disintegrating tablet Take 1 tablet (10 mg total) by mouth as needed  for migraine. May repeat in 2 hours if needed 10 tablet 5   topiramate ER (QUDEXY XR) 150 MG CS24 sprinkle capsule Take 150 mg by mouth at bedtime.     No current facility-administered medications on file prior to visit.    ALLERGIES: Patient has no known allergies.  SH:  single, non smoker  Review of Systems  Constitutional: Negative.   Genitourinary: Negative.     PHYSICAL EXAMINATION:    BP 117/80 (BP Location: Right Arm, Patient Position: Sitting, Cuff Size: Large)   Pulse (!) 104   Ht 5\' 8"  (1.727 m) Comment: Reported  Wt 269 lb 12.8 oz (122.4 kg)   LMP 09/26/2022 (Approximate)   BMI 41.02 kg/m     General appearance: alert, cooperative and appears stated age CV:  Regular rate and rhythm Lungs:  clear to auscultation, no wheezes, rales or rhonchi, symmetric air entry Ext:  no edema  Assessment/Plan: 1. Encounter for management of intrauterine contraceptive device (IUD), unspecified IUD management type - due to prior experience, pt desires placement in the operating room - risks including uterus perforation and need for additional procedure, malposition and need for removal, failure discussed.  If misses cycle, will need close follow up to monitor for ectopic pregnancy.  2. Pelvic pain - peri-operative pain management discussed.  Questions answered.  Total time with pt:  24 minutes Documentation and chart review: 6 minutes Total time with pt and documentation 30 minutes

## 2022-10-16 ENCOUNTER — Encounter (HOSPITAL_BASED_OUTPATIENT_CLINIC_OR_DEPARTMENT_OTHER): Payer: Self-pay

## 2022-10-16 ENCOUNTER — Telehealth: Payer: Self-pay

## 2022-10-16 NOTE — Telephone Encounter (Signed)
Called patient to offer scheduling options for surgery w/ Dr. Hyacinth Meeker. Patient chose 11/19/22 at 2 pm @[facility]. Patient was provided with date, time, location and pre-op instructions. Patient is aware she must arrive at noon.

## 2022-10-21 DIAGNOSIS — M546 Pain in thoracic spine: Secondary | ICD-10-CM | POA: Diagnosis not present

## 2022-10-21 DIAGNOSIS — M9902 Segmental and somatic dysfunction of thoracic region: Secondary | ICD-10-CM | POA: Diagnosis not present

## 2022-10-21 DIAGNOSIS — M9901 Segmental and somatic dysfunction of cervical region: Secondary | ICD-10-CM | POA: Diagnosis not present

## 2022-10-21 DIAGNOSIS — M542 Cervicalgia: Secondary | ICD-10-CM | POA: Diagnosis not present

## 2022-10-22 DIAGNOSIS — F902 Attention-deficit hyperactivity disorder, combined type: Secondary | ICD-10-CM | POA: Diagnosis not present

## 2022-10-22 DIAGNOSIS — F339 Major depressive disorder, recurrent, unspecified: Secondary | ICD-10-CM | POA: Diagnosis not present

## 2022-10-22 DIAGNOSIS — F411 Generalized anxiety disorder: Secondary | ICD-10-CM | POA: Diagnosis not present

## 2022-10-22 DIAGNOSIS — F410 Panic disorder [episodic paroxysmal anxiety] without agoraphobia: Secondary | ICD-10-CM | POA: Diagnosis not present

## 2022-11-01 ENCOUNTER — Other Ambulatory Visit (HOSPITAL_BASED_OUTPATIENT_CLINIC_OR_DEPARTMENT_OTHER): Payer: Self-pay | Admitting: Obstetrics & Gynecology

## 2022-11-01 ENCOUNTER — Other Ambulatory Visit (HOSPITAL_COMMUNITY): Payer: Self-pay | Admitting: Obstetrics & Gynecology

## 2022-11-01 DIAGNOSIS — Z01818 Encounter for other preprocedural examination: Secondary | ICD-10-CM

## 2022-11-01 DIAGNOSIS — Z975 Presence of (intrauterine) contraceptive device: Secondary | ICD-10-CM

## 2022-11-08 ENCOUNTER — Encounter: Payer: Self-pay | Admitting: Obstetrics and Gynecology

## 2022-11-08 ENCOUNTER — Encounter (HOSPITAL_BASED_OUTPATIENT_CLINIC_OR_DEPARTMENT_OTHER): Payer: Self-pay | Admitting: Obstetrics & Gynecology

## 2022-11-12 ENCOUNTER — Encounter (HOSPITAL_BASED_OUTPATIENT_CLINIC_OR_DEPARTMENT_OTHER): Payer: Self-pay | Admitting: Obstetrics & Gynecology

## 2022-11-13 ENCOUNTER — Other Ambulatory Visit (HOSPITAL_BASED_OUTPATIENT_CLINIC_OR_DEPARTMENT_OTHER): Payer: Self-pay | Admitting: Obstetrics & Gynecology

## 2022-11-13 DIAGNOSIS — M9902 Segmental and somatic dysfunction of thoracic region: Secondary | ICD-10-CM | POA: Diagnosis not present

## 2022-11-13 DIAGNOSIS — M542 Cervicalgia: Secondary | ICD-10-CM | POA: Diagnosis not present

## 2022-11-13 DIAGNOSIS — M9901 Segmental and somatic dysfunction of cervical region: Secondary | ICD-10-CM | POA: Diagnosis not present

## 2022-11-13 DIAGNOSIS — M546 Pain in thoracic spine: Secondary | ICD-10-CM | POA: Diagnosis not present

## 2022-11-15 ENCOUNTER — Encounter (HOSPITAL_COMMUNITY): Payer: Self-pay | Admitting: Obstetrics & Gynecology

## 2022-11-18 ENCOUNTER — Encounter (HOSPITAL_COMMUNITY): Payer: Self-pay | Admitting: Obstetrics & Gynecology

## 2022-11-18 ENCOUNTER — Other Ambulatory Visit: Payer: Self-pay

## 2022-11-18 DIAGNOSIS — M546 Pain in thoracic spine: Secondary | ICD-10-CM | POA: Diagnosis not present

## 2022-11-18 DIAGNOSIS — M9902 Segmental and somatic dysfunction of thoracic region: Secondary | ICD-10-CM | POA: Diagnosis not present

## 2022-11-18 DIAGNOSIS — M9901 Segmental and somatic dysfunction of cervical region: Secondary | ICD-10-CM | POA: Diagnosis not present

## 2022-11-18 DIAGNOSIS — M542 Cervicalgia: Secondary | ICD-10-CM | POA: Diagnosis not present

## 2022-11-18 NOTE — Pre-Procedure Instructions (Signed)
PCP - Gwenlyn Found, MD  Anesthesia review: N  Patient verbally denies any shortness of breath, fever, cough and chest pain during phone call   -------------  SDW INSTRUCTIONS given:  Your procedure is scheduled on Tues, July 9th.  Report to [facility] Main Entrance "A" at 0800 A.M., and check in at the Admitting office.  Call this number if you have problems the morning of surgery:  [phone]   Remember:  Do not eat after midnight the night before your surgery  You may drink clear liquids until 0730 the morning of your surgery.   Clear liquids allowed are: Water, Non-Citrus Juices (without pulp), Carbonated Beverages, Clear Tea, Black Coffee Only, and Gatorade    Take these medicines the morning of surgery with A SIP OF WATER  buPROPion (WELLBUTRIN XL)  escitalopram (LEXAPRO)    As of today, STOP taking any Aspirin (unless otherwise instructed by your surgeon) Aleve, Naproxen, Ibuprofen, Motrin, Advil, Goody's, BC's, all herbal medications, fish oil, and all vitamins.                      Do not wear jewelry, make up, or nail polish            Do not wear lotions, powders, perfumes/colognes, or deodorant.            Do not shave 48 hours prior to surgery.  Men may shave face and neck.            Do not bring valuables to the hospital.            [facility] is not responsible for any belongings or valuables.  Do NOT Smoke (Tobacco/Vaping) 24 hours prior to your procedure If you use a CPAP at night, you may bring all equipment for your overnight stay.   Contacts, glasses, dentures or bridgework may not be worn into surgery.      For patients admitted to the hospital, discharge time will be determined by your treatment team.   Patients discharged the day of surgery will not be allowed to drive home, and someone needs to stay with them for 24 hours.    Special instructions:   [place]- Preparing For Surgery  Before surgery, you can play an important role.  Because skin is not sterile, your skin needs to be as free of germs as possible. You can reduce the number of germs on your skin by washing with CHG (chlorahexidine gluconate) Soap before surgery.  CHG is an antiseptic cleaner which kills germs and bonds with the skin to continue killing germs even after washing.    Oral Hygiene is also important to reduce your risk of infection.  Remember - BRUSH YOUR TEETH THE MORNING OF SURGERY WITH YOUR REGULAR TOOTHPASTE  Please do not use if you have an allergy to CHG or antibacterial soaps. If your skin becomes reddened/irritated stop using the CHG.  Do not shave (including legs and underarms) for at least 48 hours prior to first CHG shower. It is OK to shave your face.  Please follow these instructions carefully.   Shower the NIGHT BEFORE SURGERY and the MORNING OF SURGERY with DIAL Soap.   Pat yourself dry with a CLEAN TOWEL.  Wear CLEAN PAJAMAS to bed the night before surgery  Place CLEAN SHEETS on your bed the night of your first shower and DO NOT SLEEP WITH PETS.   Day of Surgery: Please shower morning of surgery  Wear Clean/Comfortable  clothing the morning of surgery Do not apply any deodorants/lotions.   Remember to brush your teeth WITH YOUR REGULAR TOOTHPASTE.   Questions were answered. Patient verbalized understanding of instructions.

## 2022-11-19 ENCOUNTER — Encounter (HOSPITAL_COMMUNITY)
Admission: RE | Disposition: A | Discharge status: Home / Self Care | Payer: Self-pay | Source: Home / Self Care | Attending: Obstetrics & Gynecology

## 2022-11-19 ENCOUNTER — Ambulatory Visit (HOSPITAL_COMMUNITY): Payer: BC Managed Care – PPO | Admitting: Certified Registered Nurse Anesthetist

## 2022-11-19 ENCOUNTER — Ambulatory Visit (HOSPITAL_COMMUNITY): Payer: BC Managed Care – PPO

## 2022-11-19 ENCOUNTER — Ambulatory Visit (HOSPITAL_COMMUNITY)
Admission: RE | Admit: 2022-11-19 | Discharge: 2022-11-19 | Disposition: A | Discharge status: Home / Self Care | Payer: BC Managed Care – PPO | Source: Ambulatory Visit | Attending: Obstetrics & Gynecology | Admitting: Obstetrics & Gynecology

## 2022-11-19 ENCOUNTER — Encounter (HOSPITAL_COMMUNITY): Payer: Self-pay | Admitting: Obstetrics & Gynecology

## 2022-11-19 ENCOUNTER — Other Ambulatory Visit: Payer: Self-pay

## 2022-11-19 ENCOUNTER — Other Ambulatory Visit (HOSPITAL_COMMUNITY): Payer: BC Managed Care – PPO

## 2022-11-19 ENCOUNTER — Ambulatory Visit (HOSPITAL_COMMUNITY)
Admission: RE | Admit: 2022-11-19 | Discharge: 2022-11-19 | Disposition: A | Discharge status: Home / Self Care | Payer: BC Managed Care – PPO | Attending: Obstetrics & Gynecology | Admitting: Obstetrics & Gynecology

## 2022-11-19 DIAGNOSIS — E669 Obesity, unspecified: Secondary | ICD-10-CM | POA: Insufficient documentation

## 2022-11-19 DIAGNOSIS — G43909 Migraine, unspecified, not intractable, without status migrainosus: Secondary | ICD-10-CM | POA: Diagnosis not present

## 2022-11-19 DIAGNOSIS — Z6836 Body mass index (BMI) 36.0-36.9, adult: Secondary | ICD-10-CM | POA: Diagnosis not present

## 2022-11-19 DIAGNOSIS — F419 Anxiety disorder, unspecified: Secondary | ICD-10-CM | POA: Diagnosis not present

## 2022-11-19 DIAGNOSIS — Z975 Presence of (intrauterine) contraceptive device: Secondary | ICD-10-CM

## 2022-11-19 DIAGNOSIS — Z30430 Encounter for insertion of intrauterine contraceptive device: Secondary | ICD-10-CM | POA: Diagnosis not present

## 2022-11-19 DIAGNOSIS — Z01818 Encounter for other preprocedural examination: Secondary | ICD-10-CM

## 2022-11-19 HISTORY — DX: Other specified postprocedural states: Z98.890

## 2022-11-19 HISTORY — PX: INTRAUTERINE DEVICE (IUD) INSERTION: SHX5877

## 2022-11-19 HISTORY — DX: Anemia, unspecified: D64.9

## 2022-11-19 HISTORY — DX: Nausea with vomiting, unspecified: R11.2

## 2022-11-19 HISTORY — PX: OPERATIVE ULTRASOUND: SHX5996

## 2022-11-19 LAB — CBC
HCT: 42.4 % (ref 36.0–46.0)
Hemoglobin: 14 g/dL (ref 12.0–15.0)
MCH: 29.6 pg (ref 26.0–34.0)
MCHC: 33 g/dL (ref 30.0–36.0)
MCV: 89.6 fL (ref 80.0–100.0)
Platelets: 238 10*3/uL (ref 150–400)
RBC: 4.73 MIL/uL (ref 3.87–5.11)
RDW: 12.5 % (ref 11.5–15.5)
WBC: 5.9 10*3/uL (ref 4.0–10.5)
nRBC: 0 % (ref 0.0–0.2)

## 2022-11-19 LAB — POCT PREGNANCY, URINE: Preg Test, Ur: NEGATIVE

## 2022-11-19 SURGERY — INSERTION, INTRAUTERINE DEVICE
Anesthesia: Monitor Anesthesia Care | Site: Uterus

## 2022-11-19 MED ORDER — PARAGARD INTRAUTERINE COPPER IU IUD
1.0000 | INTRAUTERINE_SYSTEM | INTRAUTERINE | Status: AC
  Administered 2022-11-19: 1 via INTRAUTERINE
  Filled 2022-11-19: qty 1

## 2022-11-19 MED ORDER — LIDOCAINE HCL 1 % IJ SOLN
INTRAMUSCULAR | Status: DC | PRN
  Administered 2022-11-19: 10 mL

## 2022-11-19 MED ORDER — MIDAZOLAM HCL 2 MG/2ML IJ SOLN
INTRAMUSCULAR | Status: DC | PRN
  Administered 2022-11-19: 2 mg via INTRAVENOUS

## 2022-11-19 MED ORDER — ACETAMINOPHEN 500 MG PO TABS
1000.0000 mg | ORAL_TABLET | Freq: Once | ORAL | Status: AC
  Administered 2022-11-19: 1000 mg via ORAL
  Filled 2022-11-19: qty 2

## 2022-11-19 MED ORDER — ACETAMINOPHEN 500 MG PO TABS: 1000.0000 mg | ORAL_TABLET | ORAL | Status: DC

## 2022-11-19 MED ORDER — MIDAZOLAM HCL 2 MG/2ML IJ SOLN
INTRAMUSCULAR | Status: AC
  Filled 2022-11-19: qty 2

## 2022-11-19 MED ORDER — CHLORHEXIDINE GLUCONATE 0.12 % MT SOLN
15.0000 mL | Freq: Once | OROMUCOSAL | Status: AC
  Administered 2022-11-19: 15 mL via OROMUCOSAL
  Filled 2022-11-19: qty 15

## 2022-11-19 MED ORDER — PROPOFOL 10 MG/ML IV BOLUS
INTRAVENOUS | Status: AC
  Filled 2022-11-19: qty 20

## 2022-11-19 MED ORDER — IBUPROFEN 800 MG PO TABS: 800.0000 mg | ORAL_TABLET | Freq: Three times a day (TID) | ORAL | 0 refills | Status: AC | PRN

## 2022-11-19 MED ORDER — HYDROCODONE-ACETAMINOPHEN 5-325 MG PO TABS: 1.0000 | ORAL_TABLET | Freq: Four times a day (QID) | ORAL | 0 refills | Status: AC | PRN

## 2022-11-19 MED ORDER — LACTATED RINGERS IV SOLN: INTRAVENOUS | Status: DC

## 2022-11-19 MED ORDER — ORAL CARE MOUTH RINSE: 15.0000 mL | Freq: Once | OROMUCOSAL | Status: AC

## 2022-11-19 MED ORDER — PROPOFOL 1000 MG/100ML IV EMUL
INTRAVENOUS | Status: AC
  Filled 2022-11-19: qty 100

## 2022-11-19 MED ORDER — POVIDONE-IODINE 10 % EX SWAB
2.0000 | Freq: Once | CUTANEOUS | Status: AC
  Administered 2022-11-19: 2 via TOPICAL

## 2022-11-19 MED ORDER — PROPOFOL 10 MG/ML IV BOLUS
INTRAVENOUS | Status: DC | PRN
  Administered 2022-11-19: 50 mg via INTRAVENOUS
  Administered 2022-11-19: 60 mg via INTRAVENOUS
  Administered 2022-11-19: 50 mg via INTRAVENOUS
  Administered 2022-11-19: 20 mg via INTRAVENOUS
  Administered 2022-11-19: 70 mg via INTRAVENOUS

## 2022-11-19 SURGICAL SUPPLY — 9 items
CNTNR URN SCR LID CUP LEK RST (MISCELLANEOUS) IMPLANT
CONT SPEC 4OZ STRL OR WHT (MISCELLANEOUS) ×2
GLOVE BIOGEL PI IND STRL 7.0 (GLOVE) ×6 IMPLANT
GLOVE ECLIPSE 7.0 STRL STRAW (GLOVE) ×3 IMPLANT
NDL SPNL 22GX3.5 QUINCKE BK (NEEDLE) IMPLANT
NEEDLE SPNL 22GX3.5 QUINCKE BK (NEEDLE) ×2 IMPLANT
PAD OB MATERNITY 4.3X12.25 (PERSONAL CARE ITEMS) ×3 IMPLANT
SCOPETTES 8 STERILE (MISCELLANEOUS) IMPLANT
SYR CONTROL 10ML LL (SYRINGE) IMPLANT

## 2022-11-19 NOTE — Op Note (Addendum)
11/19/2022  11:12 AM  PATIENT:  Rebecca Dunn  22 y.o. female  PRE-OPERATIVE DIAGNOSIS:  Contraception, significant pain with office placement.  POST-OPERATIVE DIAGNOSIS:  Contraception, significant pain with office placement.  PROCEDURE:  Procedure(s): INTRAUTERINE DEVICE (IUD) INSERTION OPERATIVE ULTRASOUND  SURGEON:  Jerene Bears  ASSISTANTS: OR staff.    ANESTHESIA:    propofol  ESTIMATED BLOOD LOSS: 5cc  BLOOD ADMINISTERED:none   FLUIDS: 400cc LR  UOP: no I&O done in the OR  SPECIMEN:  none  DISPOSITION OF SPECIMEN:  N/A  FINDINGS: normal sized uterus  DESCRIPTION OF OPERATION:  Patient was taken to the operating room.  She is placed in the supine position. Propofol was administered without difficulty. Dr. Mal Amabile, anesthesia, oversaw case.  Legs were then placed in the [facility] stirrups in the low lithotomy position. The legs were lifted to the high lithotomy position and the Betadine prep was used on the inner thighs perineum and vagina x3. Paracervical block was placed.  Single toothed tenaculum applied to anterior lip of cervix without difficulty.  1% Lidocaine without epinephrine was used.  10cc total instilled at 3 and 9 o'clock positions on cervix.  Uterus sounded to 8cm.  IUD package was opened.  IUD and introducer passed to fundus and then withdrawn slightly before IUD was passed into endometrial cavity.  Introducer removed.  Strings cut to 2cm.  Tenaculum removed from cervix.  Minimal bleeding noted.  Pt tolerated the procedure well.  All instruments removed from vagina.  Attempt made at ultrasound imaging with IUD insertion and then afterwards but pt did not tolerate pressure from abdominal probe so no images were obtained.  Pt did move a considerable amount with the paracervical block and placement of tenaculum but was still with actual placement of IUD.  The legs are positioned back in the supine position. Sponge, lap, needle, instrument counts were correct  x2. Patient was taken to recovery in stable condition.   COUNTS:  YES  PLAN OF CARE: Transfer to PACU

## 2022-11-19 NOTE — H&P (Signed)
Rebecca Dunn is an 22 y.o. female G0 SWF with hx of prior IUD use.  Pt had Kyleena IUD placed in 04/2019.  Did well with this but decided to have IUD removed and replaced for a non hormonal IUD.  The paragard IUD was then placed 03/2021.  Afterwards, pt had significant amount of pain and cramping and IUD was noted to be malpositioned with arm in the myometrium on 1/204.  IUD removal was done in the office and she desired it be done int he OR.  This was a negative experience for her due to the severe cramping.  Pt desires non hormonal IUD and this is confirmed with her today.  Procedure reviewed.  Risks and benefits discussed.  Questions answered.  Pertinent Gynecological History: Menses: flow is moderate Bleeding: regular with LMP 11/10/2022 Contraception: none DES exposure: denies Blood transfusions: none Sexually transmitted diseases: no past history Previous GYN Procedures:  IUD placement and then later removal in the office   Last mammogram: n/a Last pap: normal Date: 05/15/2022 OB History: G0, P0   Menstrual History: Patient's last menstrual period was 11/10/2022.    Past Medical History:  Diagnosis Date   Abnormal uterine bleeding    Anemia    Anxiety    Concussion 04/05/2013   volleyball   Elevated serum creatinine 2020   Hyperlipidemia    LGSIL on Pap smear of cervix 02/2021   Low ferritin 2019   Migraines    Hx of   Ovarian cyst    PONV (postoperative nausea and vomiting)     Past Surgical History:  Procedure Laterality Date   BREAST REDUCTION SURGERY  04/12/2020   with fibroadenoma removal   CYSTOSCOPY  11/16/2014   [facility]   LAPAROSCOPY N/A 08/16/2014   Procedure: LAPAROSCOPY DIAGNOSTIC, BIOPSY OF POSTERIOR CUL DE [facility];  Surgeon: Reva Bores, MD;  Location: [facility];  Service: Gynecology;  Laterality: N/A;    Family History  Problem Relation Age of Onset   Cancer Paternal Grandfather        dec 69 Leukemia   Breast cancer Paternal Grandmother 69    Seizures Mother        Epilepsy as child   Diabetes Maternal Grandfather    Thyroid disease Maternal Grandmother        hyperthyroid    Social History:  reports that she has never smoked. She has never used smokeless tobacco. She reports current alcohol use of about 3.0 standard drinks of alcohol per week. She reports that she does not use drugs.  Allergies: No Known Allergies  Medications Prior to Admission  Medication Sig Dispense Refill Last Dose   amphetamine-dextroamphetamine (ADDERALL XR) 15 MG 24 hr capsule Take 15 mg by mouth every morning.   11/18/2022   buPROPion (WELLBUTRIN XL) 300 MG 24 hr tablet Take 300 mg by mouth every morning.   11/18/2022   escitalopram (LEXAPRO) 20 MG tablet Take 20 mg by mouth daily.   11/18/2022   ibuprofen (ADVIL) 200 MG tablet Take 600 mg by mouth every 8 (eight) hours as needed for moderate pain or headache.   Past Week   lamoTRIgine (LAMICTAL) 150 MG tablet Take 150 mg by mouth at bedtime.   11/18/2022   Multiple Vitamins-Minerals (MULTIVITAMIN WITH MINERALS) tablet Take 1 tablet by mouth daily. Women's Health   11/18/2022   Omega-3 Fatty Acids (FISH OIL) 1000 MG CAPS Take 1,000 mg by mouth daily.   11/18/2022   Topiramate ER (TROKENDI XR) 100 MG  CP24 Take 100 mg by mouth at bedtime.   11/18/2022   rizatriptan (MAXALT-MLT) 10 MG disintegrating tablet Take 1 tablet (10 mg total) by mouth as needed for migraine. May repeat in 2 hours if needed 10 tablet 5 More than a month    Review of Systems  Constitutional: Negative.   Genitourinary: Negative.     Blood pressure 125/66, pulse 88, temperature 98.1 F (36.7 C), temperature source Oral, resp. rate 18, height 5\' 8"  (1.727 m), weight 108.9 kg, last menstrual period 11/10/2022, SpO2 98 %. Physical Exam Constitutional:      Appearance: Normal appearance.  Cardiovascular:     Rate and Rhythm: Normal rate and regular rhythm.  Pulmonary:     Effort: Pulmonary effort is normal.     Breath sounds: Normal breath  sounds.  Neurological:     General: No focal deficit present.     Mental Status: She is alert.  Psychiatric:        Mood and Affect: Mood normal.        Behavior: Behavior normal.        Judgment: Judgment normal.     Results for orders placed or performed during the hospital encounter of 11/19/22 (from the past 24 hour(s))  Pregnancy, urine POC     Status: None   Collection Time: 11/19/22  9:15 AM  Result Value Ref Range   Preg Test, Ur NEGATIVE NEGATIVE  CBC     Status: None   Collection Time: 11/19/22  9:17 AM  Result Value Ref Range   WBC 5.9 4.0 - 10.5 K/uL   RBC 4.73 3.87 - 5.11 MIL/uL   Hemoglobin 14.0 12.0 - 15.0 g/dL   HCT 09.8 11.9 - 14.7 %   MCV 89.6 80.0 - 100.0 fL   MCH 29.6 26.0 - 34.0 pg   MCHC 33.0 30.0 - 36.0 g/dL   RDW 82.9 56.2 - 13.0 %   Platelets 238 150 - 400 K/uL   nRBC 0.0 0.0 - 0.2 %    No results found.  Assessment/Plan: 22 yo G0 desirous of IUD placement in OR due to prior negative experience with IUD placement and removal.  She desires paragard IUD due to no hormones.  Questions answered.  She is ready to proceed.  Jerene Bears 11/19/2022, 10:06 AM

## 2022-11-19 NOTE — Anesthesia Preprocedure Evaluation (Addendum)
Anesthesia Evaluation  Patient identified by MRN, date of birth, ID band Patient awake    Reviewed: Allergy & Precautions, NPO status , Patient's Chart, lab work & pertinent test results  History of Anesthesia Complications (+) PONV and history of anesthetic complications  Airway Mallampati: II  TM Distance: >3 FB Neck ROM: Full    Dental  (+) Dental Advisory Given, Teeth Intact   Pulmonary neg pulmonary ROS   Pulmonary exam normal        Cardiovascular negative cardio ROS Normal cardiovascular exam     Neuro/Psych  Headaches PSYCHIATRIC DISORDERS Anxiety        GI/Hepatic negative GI ROS, Neg liver ROS,,,  Endo/Other   Obesity   Renal/GU negative Renal ROS     Musculoskeletal negative musculoskeletal ROS (+)    Abdominal  (+) + obese  Peds  Hematology negative hematology ROS (+)   Anesthesia Other Findings   Reproductive/Obstetrics                             Anesthesia Physical Anesthesia Plan  ASA: 2  Anesthesia Plan: MAC   Post-op Pain Management: Tylenol PO (pre-op)*   Induction:   PONV Risk Score and Plan: 3 and Propofol infusion and Treatment may vary due to age or medical condition  Airway Management Planned: Natural Airway and Simple Face Mask  Additional Equipment: None  Intra-op Plan:   Post-operative Plan:   Informed Consent: I have reviewed the patients History and Physical, chart, labs and discussed the procedure including the risks, benefits and alternatives for the proposed anesthesia with the patient or authorized representative who has indicated his/her understanding and acceptance.       Plan Discussed with: CRNA and Anesthesiologist  Anesthesia Plan Comments:         Anesthesia Quick Evaluation

## 2022-11-19 NOTE — Transfer of Care (Signed)
Immediate Anesthesia Transfer of Care Note  Patient: Rebecca Dunn  Procedure(s) Performed: INTRAUTERINE DEVICE (IUD) INSERTION (Uterus) OPERATIVE ULTRASOUND  Patient Location: PACU  Anesthesia Type:MAC  Level of Consciousness: awake, alert , and oriented  Airway & Oxygen Therapy: Patient Spontanous Breathing  Post-op Assessment: Report given to RN, Post -op Vital signs reviewed and stable, and Patient moving all extremities X 4  Post vital signs: Reviewed and stable  Last Vitals:  Vitals Value Taken Time  BP 111/65 11/19/22 1134  Temp 36.7 C 11/19/22 1101  Pulse 76 11/19/22 1135  Resp 17 11/19/22 1135  SpO2 97 % 11/19/22 1135  Vitals shown include unvalidated device data.  Last Pain:  Vitals:   11/19/22 1101  TempSrc:   PainSc: 3       Patients Stated Pain Goal: 3 (11/19/22 1101)  Complications: No notable events documented.

## 2022-11-19 NOTE — Anesthesia Postprocedure Evaluation (Signed)
Anesthesia Post Note  Patient: Samyah Lindsey [place]  Procedure(s) Performed: INTRAUTERINE DEVICE (IUD) INSERTION (Uterus) OPERATIVE ULTRASOUND     Patient location during evaluation: PACU Anesthesia Type: MAC Level of consciousness: awake and alert Pain management: pain level controlled Vital Signs Assessment: post-procedure vital signs reviewed and stable Respiratory status: spontaneous breathing, nonlabored ventilation and respiratory function stable Cardiovascular status: stable and blood pressure returned to baseline Anesthetic complications: no   No notable events documented.  Last Vitals:  Vitals:   11/19/22 1130 11/19/22 1134  BP: 98/60 111/65  Pulse: 78 77  Resp: 19 19  Temp:  36.5 C  SpO2: 95% 95%    Last Pain:  Vitals:   11/19/22 1101  TempSrc:   PainSc: 3                  Beryle Lathe

## 2022-11-20 ENCOUNTER — Encounter (HOSPITAL_COMMUNITY): Payer: Self-pay | Admitting: Obstetrics & Gynecology

## 2022-11-22 DIAGNOSIS — M546 Pain in thoracic spine: Secondary | ICD-10-CM | POA: Diagnosis not present

## 2022-11-22 DIAGNOSIS — M542 Cervicalgia: Secondary | ICD-10-CM | POA: Diagnosis not present

## 2022-11-22 DIAGNOSIS — M9902 Segmental and somatic dysfunction of thoracic region: Secondary | ICD-10-CM | POA: Diagnosis not present

## 2022-11-22 DIAGNOSIS — M9901 Segmental and somatic dysfunction of cervical region: Secondary | ICD-10-CM | POA: Diagnosis not present

## 2022-11-25 DIAGNOSIS — F419 Anxiety disorder, unspecified: Secondary | ICD-10-CM

## 2022-11-25 DIAGNOSIS — Z30430 Encounter for insertion of intrauterine contraceptive device: Secondary | ICD-10-CM

## 2022-12-09 DIAGNOSIS — M9902 Segmental and somatic dysfunction of thoracic region: Secondary | ICD-10-CM | POA: Diagnosis not present

## 2022-12-09 DIAGNOSIS — M9901 Segmental and somatic dysfunction of cervical region: Secondary | ICD-10-CM | POA: Diagnosis not present

## 2022-12-09 DIAGNOSIS — M542 Cervicalgia: Secondary | ICD-10-CM | POA: Diagnosis not present

## 2022-12-09 DIAGNOSIS — M546 Pain in thoracic spine: Secondary | ICD-10-CM | POA: Diagnosis not present

## 2022-12-10 DIAGNOSIS — Z1331 Encounter for screening for depression: Secondary | ICD-10-CM | POA: Diagnosis not present

## 2022-12-10 DIAGNOSIS — F3341 Major depressive disorder, recurrent, in partial remission: Secondary | ICD-10-CM | POA: Diagnosis not present

## 2022-12-10 DIAGNOSIS — F411 Generalized anxiety disorder: Secondary | ICD-10-CM | POA: Diagnosis not present

## 2022-12-10 DIAGNOSIS — F902 Attention-deficit hyperactivity disorder, combined type: Secondary | ICD-10-CM | POA: Diagnosis not present

## 2023-01-03 ENCOUNTER — Telehealth (HOSPITAL_BASED_OUTPATIENT_CLINIC_OR_DEPARTMENT_OTHER): Payer: Self-pay | Admitting: Obstetrics & Gynecology

## 2023-01-03 ENCOUNTER — Encounter (HOSPITAL_BASED_OUTPATIENT_CLINIC_OR_DEPARTMENT_OTHER): Payer: Self-pay | Admitting: Obstetrics & Gynecology

## 2023-01-03 NOTE — Telephone Encounter (Signed)
Called patient on home and cell phone and left a message to see if she was on the way to her 11:15 am appointment. And to please call the office.

## 2023-01-27 DIAGNOSIS — G44219 Episodic tension-type headache, not intractable: Secondary | ICD-10-CM | POA: Diagnosis not present

## 2023-01-27 DIAGNOSIS — G43009 Migraine without aura, not intractable, without status migrainosus: Secondary | ICD-10-CM | POA: Diagnosis not present

## 2023-01-27 DIAGNOSIS — Z136 Encounter for screening for cardiovascular disorders: Secondary | ICD-10-CM | POA: Diagnosis not present

## 2023-01-27 DIAGNOSIS — Z0000 Encounter for general adult medical examination without abnormal findings: Secondary | ICD-10-CM | POA: Diagnosis not present

## 2023-02-21 DIAGNOSIS — F330 Major depressive disorder, recurrent, mild: Secondary | ICD-10-CM | POA: Diagnosis not present

## 2023-02-21 DIAGNOSIS — G479 Sleep disorder, unspecified: Secondary | ICD-10-CM | POA: Diagnosis not present

## 2023-02-21 DIAGNOSIS — R454 Irritability and anger: Secondary | ICD-10-CM | POA: Diagnosis not present

## 2023-02-21 DIAGNOSIS — F900 Attention-deficit hyperactivity disorder, predominantly inattentive type: Secondary | ICD-10-CM | POA: Diagnosis not present

## 2023-02-26 DIAGNOSIS — E66813 Obesity, class 3: Secondary | ICD-10-CM | POA: Diagnosis not present

## 2023-02-26 DIAGNOSIS — Z7182 Exercise counseling: Secondary | ICD-10-CM | POA: Diagnosis not present

## 2023-02-26 DIAGNOSIS — Z1329 Encounter for screening for other suspected endocrine disorder: Secondary | ICD-10-CM | POA: Diagnosis not present

## 2023-02-26 DIAGNOSIS — Z6841 Body Mass Index (BMI) 40.0 and over, adult: Secondary | ICD-10-CM | POA: Diagnosis not present

## 2023-02-26 DIAGNOSIS — Z713 Dietary counseling and surveillance: Secondary | ICD-10-CM | POA: Diagnosis not present

## 2023-02-26 DIAGNOSIS — Z131 Encounter for screening for diabetes mellitus: Secondary | ICD-10-CM | POA: Diagnosis not present

## 2023-02-26 DIAGNOSIS — F331 Major depressive disorder, recurrent, moderate: Secondary | ICD-10-CM | POA: Diagnosis not present

## 2023-02-26 DIAGNOSIS — R5383 Other fatigue: Secondary | ICD-10-CM | POA: Diagnosis not present

## 2024-04-12 ENCOUNTER — Other Ambulatory Visit: Payer: Self-pay

## 2024-04-12 ENCOUNTER — Encounter: Payer: Self-pay | Admitting: Physician Assistant

## 2024-04-12 ENCOUNTER — Ambulatory Visit: Admitting: Physician Assistant

## 2024-04-12 DIAGNOSIS — S93412A Sprain of calcaneofibular ligament of left ankle, initial encounter: Secondary | ICD-10-CM

## 2024-04-12 NOTE — Progress Notes (Addendum)
 Office Visit Note   Patient: Rebecca Dunn           Date of Birth: Nov 02, 2000           MRN: [id number] Visit Date: 04/12/2024              Requested by: Leila Lucie LABOR, MD [id number],  [place] [address] PCP: Leila Lucie LABOR, MD   Assessment & Plan: Visit Diagnoses:  1. Sprain of calcaneofibular ligament of left ankle, initial encounter     Plan: She will continue the use of ibuprofen  advised her to take Tylenol  2 g daily for the next week or so.  In regards to the ankle we will place her in a cam walker boot at all times for 2 weeks then she will advance to ASO brace which is supplied today ,while in the home still wearing the cam walker boot outside the home.  She does not need to sleep in the cam walker boot.  She is weightbearing as tolerated, ice, elevation, wiggling toes and dorsiflexion plantarflexion of ankle encouraged.  Follow-up in 4 weeks for reevaluation.  Questions were encouraged and answered.  Follow-Up Instructions: Return in about 4 weeks (around 05/10/2024).   Orders:  Orders Placed This Encounter  Procedures   XR Ankle Complete Left   No orders of the defined types were placed in this encounter.     Procedures: No procedures performed   Clinical Data: No additional findings.   Subjective: Chief Complaint  Patient presents with   Left Ankle - Pain, Injury    HPI: Patient is a 23 year old female who had a fall at a sporting event.  She tripped on a step.  No loss of consciousness dizziness.  Did not strike her head.  Twisted her left ankle.  Trouble weightbearing since.  States pain is slightly improved today.  Still having difficulty ambulating.  No other injuries.  Review of Systems  Neurological:  Negative for dizziness.  No loss of consciousness   Objective: Vital Signs: There were no vitals taken for this visit.  Physical Exam Constitutional:      Appearance: She is not ill-appearing.  Pulmonary:     Effort:  Pulmonary effort is normal.  Neurological:     Mental Status: She is alert.     Ortho Exam Left lower extremity: Dorsal pedal pulse 2+ sensation grossly intact throughout left foot.  No rashes skin lesions ulcerations.  Edema over the lateral medial malleolus.  No ecchymosis.  Achilles is intact nontender.  Nontender about the calcaneus throughout.  Maximal tenderness over the distal fibula.  Minimal tenderness over the medial malleolus.  Nontender over the proximal tib-fib.  Calf supple nontender.  Dorsiflexion plantarflexion left ankle intact.  Nontender at the posterior tibial tendon peroneal tendons on the left.  Out of 5 strength with inversion eversion against resistance left foot.  Specialty Comments:  No specialty comments available.  Imaging: XR Ankle Complete Left Result Date: 04/12/2024 Left ankle 3 views: Talus well located within the ankle mortise no diastases.  There is a slight avulsion fragment off the distal fibula consistent with a calcaneofibular sprain.  No any acute fractures or bony abnormalities.    PMFS History: Patient Active Problem List   Diagnosis Date Noted   Encounter for insertion of intrauterine contraceptive device (IUD) 11/25/2022   Anxiety 11/25/2022   Episodic tension-type headache, not intractable 03/05/2017   Daytime somnolence 12/04/2016   Acanthosis nigricans 08/13/2016  Obesity 08/13/2016   Abdominal pain 08/03/2014   Dysmenorrhea 05/31/2014   Obesity (BMI 30-39.9) 12/28/2013   Migraine without aura 11/30/2013   CHI (closed head injury) 02/09/2013   Past Medical History:  Diagnosis Date   Abnormal uterine bleeding    Anemia    Anxiety    Concussion 04/05/2013   volleyball   Elevated serum creatinine 2020   Hyperlipidemia    LGSIL on Pap smear of cervix 02/2021   Low ferritin 2019   Migraines    Hx of   Ovarian cyst    PONV (postoperative nausea and vomiting)     Family History  Problem Relation Age of Onset   Cancer Paternal  Grandfather        dec 69 Leukemia   Breast cancer Paternal Grandmother 41   Seizures Mother        Epilepsy as child   Diabetes Maternal Grandfather    Thyroid  disease Maternal Grandmother        hyperthyroid    Past Surgical History:  Procedure Laterality Date   BREAST REDUCTION SURGERY  04/12/2020   with fibroadenoma removal   CYSTOSCOPY  11/16/2014   [facility]   INTRAUTERINE DEVICE (IUD) INSERTION N/A 11/19/2022   Procedure: INTRAUTERINE DEVICE (IUD) INSERTION;  Surgeon: Cleotilde Ronal RAMAN, MD;  Location: [facility] OR;  Service: Gynecology;  Laterality: N/A;   LAPAROSCOPY N/A 08/16/2014   Procedure: LAPAROSCOPY DIAGNOSTIC, BIOPSY OF POSTERIOR CUL DE SAC;  Surgeon: Glenys RAMAN Birk, MD;  Location: [facility] OR[facility];  Service: Gynecology;  Laterality: N/A;   OPERATIVE ULTRASOUND N/A 11/19/2022   Procedure: OPERATIVE ULTRASOUND;  Surgeon: Cleotilde Ronal RAMAN, MD;  Location: [facility] OR;  Service: Gynecology;  Laterality: N/A;   Social History   Occupational History   Not on file  Tobacco Use   Smoking status: Never   Smokeless tobacco: Never  Vaping Use   Vaping status: Never Used  Substance and Sexual Activity   Alcohol use: Yes    Alcohol/week: 3.0 standard drinks of alcohol    Types: 3 Standard drinks or equivalent per week    Comment: socially   Drug use: No   Sexual activity: Not Currently    Partners: Male    Birth control/protection: I.U.D.    Comment: paragard 

## 2024-05-10 ENCOUNTER — Ambulatory Visit: Admitting: Physician Assistant

## 2024-05-12 ENCOUNTER — Other Ambulatory Visit: Payer: Self-pay

## 2024-05-12 ENCOUNTER — Encounter: Payer: Self-pay | Admitting: Physician Assistant

## 2024-05-12 ENCOUNTER — Ambulatory Visit: Payer: Self-pay | Admitting: Physician Assistant

## 2024-05-12 DIAGNOSIS — S93412A Sprain of calcaneofibular ligament of left ankle, initial encounter: Secondary | ICD-10-CM

## 2024-05-12 NOTE — Progress Notes (Signed)
 HPI: Rebecca Dunn returns today for follow-up of her left ankle.  She is now just over 5 weeks status post left ankle injury resulting in a calcaneal fibular ligament sprain with small avulsion off the distal fibula.  She continues to wear the cam walker boot when outside the home and ASO brace within the home.  She states ankle feels weak whenever she is going up and down stairs.  She is now back in [place] and is not at her parents house for the holidays.  She states that Rebecca Dunn she gets around better in the home as there are no stairs.  Review of systems see HPI  Physical exam: General well-developed well-nourished female no acute distress ambulates in CAM Walker boot without any assist device otherwise. Vascular: Left dorsal pedal pulses 2+ left calf supple nontender. Left ankle full dorsiflexion plantarflexion without pain.  No tenderness over the posterior tibial tendon or peroneal tendons.  Discomfort with extremes of inversion eversion left foot.  Tenderness over the distal left fibula.  No tenderness over the medial malleolus.  No tenderness over the proximal tib-fib.  No rashes skin lesions ulcerations throughout left foot.  Radiographs: Left ankle 3 views talus well located within the ankle mortise no diastases.  Reabsorption of the distal fibular avulsion fragment since prior x-ray.  Otherwise no acute fractures acute injuries.  Impression: Left ankle sprain  Plan: She is given a prescription for physical therapy which she will start at 6 weeks post injury to work on range of motion, strengthening.  They will work on weaning her out of ASO brace as proprioception and strength improve.  They can include modalities and home exercise program.  She will follow-up with us  if symptoms do not resolve or become worse.  She also is to return if she develops any mechanical symptoms of the left ankle which were reviewed with the patient today.
